# Patient Record
Sex: Male | Born: 1965 | Race: White | Hispanic: No | Marital: Married
Health system: Midwestern US, Academic
[De-identification: ages and names within clinical notes are randomized; demographics above are authoritative.]

---

## 2017-02-15 ENCOUNTER — Encounter: Admit: 2017-02-15 | Discharge: 2017-02-15

## 2017-02-16 MED ORDER — METOPROLOL TARTRATE 100 MG PO TAB
ORAL_TABLET | Freq: Two times a day (BID) | ORAL | 0 refills | 90.00000 days | Status: AC
Start: 2017-02-16 — End: 2017-05-19

## 2017-04-30 ENCOUNTER — Encounter: Admit: 2017-04-30 | Discharge: 2017-04-30

## 2017-05-02 MED ORDER — FLECAINIDE 50 MG PO TAB
ORAL_TABLET | Freq: Two times a day (BID) | ORAL | 4 refills | 30.00000 days | Status: AC
Start: 2017-05-02 — End: 2017-09-30

## 2017-05-19 ENCOUNTER — Encounter: Admit: 2017-05-19 | Discharge: 2017-05-19

## 2017-05-19 MED ORDER — METOPROLOL TARTRATE 100 MG PO TAB
ORAL_TABLET | Freq: Two times a day (BID) | ORAL | 0 refills | 90.00000 days | Status: AC
Start: 2017-05-19 — End: 2017-08-21

## 2017-08-19 ENCOUNTER — Encounter: Admit: 2017-08-19 | Discharge: 2017-08-19

## 2017-08-21 MED ORDER — METOPROLOL TARTRATE 100 MG PO TAB
ORAL_TABLET | Freq: Two times a day (BID) | ORAL | 3 refills | 90.00000 days | Status: AC
Start: 2017-08-21 — End: 2018-08-28

## 2017-09-30 ENCOUNTER — Encounter: Admit: 2017-09-30 | Discharge: 2017-09-30

## 2017-09-30 MED ORDER — FLECAINIDE 50 MG PO TAB
ORAL_TABLET | Freq: Two times a day (BID) | ORAL | 0 refills | 30.00000 days | Status: AC
Start: 2017-09-30 — End: 2017-11-01

## 2017-10-31 ENCOUNTER — Encounter: Admit: 2017-10-31 | Discharge: 2017-10-31

## 2017-11-01 MED ORDER — FLECAINIDE 50 MG PO TAB
ORAL_TABLET | Freq: Two times a day (BID) | ORAL | 0 refills | 30.00000 days | Status: AC
Start: 2017-11-01 — End: 2017-11-28

## 2017-11-27 ENCOUNTER — Encounter: Admit: 2017-11-27 | Discharge: 2017-11-27

## 2017-11-28 MED ORDER — FLECAINIDE 50 MG PO TAB
ORAL_TABLET | Freq: Two times a day (BID) | ORAL | 0 refills | 30.00000 days | Status: AC
Start: 2017-11-28 — End: 2018-02-27

## 2017-12-02 ENCOUNTER — Encounter: Admit: 2017-12-02 | Discharge: 2017-12-02

## 2017-12-02 ENCOUNTER — Ambulatory Visit: Admit: 2017-12-02 | Discharge: 2017-12-03

## 2017-12-02 DIAGNOSIS — E119 Type 2 diabetes mellitus without complications: ICD-10-CM

## 2017-12-02 DIAGNOSIS — G4733 Obstructive sleep apnea (adult) (pediatric): Principal | ICD-10-CM

## 2017-12-02 DIAGNOSIS — I10 Essential (primary) hypertension: ICD-10-CM

## 2017-12-02 DIAGNOSIS — I517 Cardiomegaly: ICD-10-CM

## 2017-12-02 DIAGNOSIS — Z9229 Personal history of other drug therapy: ICD-10-CM

## 2017-12-02 DIAGNOSIS — I4891 Unspecified atrial fibrillation: ICD-10-CM

## 2017-12-02 DIAGNOSIS — Z0389 Encounter for observation for other suspected diseases and conditions ruled out: ICD-10-CM

## 2017-12-02 DIAGNOSIS — Z9049 Acquired absence of other specified parts of digestive tract: ICD-10-CM

## 2017-12-02 DIAGNOSIS — Z9889 Other specified postprocedural states: Secondary | ICD-10-CM

## 2017-12-03 DIAGNOSIS — I480 Paroxysmal atrial fibrillation: Principal | ICD-10-CM

## 2017-12-16 ENCOUNTER — Encounter: Admit: 2017-12-16 | Discharge: 2017-12-16

## 2017-12-21 ENCOUNTER — Encounter: Admit: 2017-12-21 | Discharge: 2017-12-21

## 2017-12-21 MED ORDER — SODIUM CHLORIDE 0.9 % IV SOLP
500 mL | INTRAVENOUS | 0 refills | Status: AC | PRN
Start: 2017-12-21 — End: ?

## 2017-12-23 ENCOUNTER — Encounter: Admit: 2017-12-23 | Discharge: 2017-12-23

## 2017-12-23 ENCOUNTER — Ambulatory Visit: Admit: 2017-12-23 | Discharge: 2017-12-24

## 2017-12-23 MED ORDER — LISINOPRIL 10 MG PO TAB
10 mg | ORAL_TABLET | Freq: Every day | ORAL | 3 refills | Status: AC
Start: 2017-12-23 — End: 2018-12-28

## 2017-12-28 ENCOUNTER — Ambulatory Visit: Admit: 2017-12-28 | Discharge: 2017-12-29

## 2017-12-28 ENCOUNTER — Encounter: Admit: 2017-12-28 | Discharge: 2017-12-28

## 2017-12-28 MED ORDER — REGADENOSON 0.4 MG/5 ML IV SYRG
.4 mg | Freq: Once | INTRAVENOUS | 0 refills | Status: CP
Start: 2017-12-28 — End: ?

## 2018-01-05 ENCOUNTER — Encounter: Admit: 2018-01-05 | Discharge: 2018-01-05

## 2018-02-25 ENCOUNTER — Encounter: Admit: 2018-02-25 | Discharge: 2018-02-25

## 2018-02-27 MED ORDER — FLECAINIDE 50 MG PO TAB
ORAL_TABLET | Freq: Two times a day (BID) | ORAL | 3 refills | 30.00000 days | Status: AC
Start: 2018-02-27 — End: 2018-08-04

## 2018-07-25 ENCOUNTER — Encounter: Admit: 2018-07-25 | Discharge: 2018-07-25

## 2018-07-25 DIAGNOSIS — R109 Unspecified abdominal pain: Secondary | ICD-10-CM

## 2018-07-26 ENCOUNTER — Inpatient Hospital Stay: Admit: 2018-07-26 | Discharge: 2018-07-26

## 2018-07-26 ENCOUNTER — Encounter: Admit: 2018-07-26 | Discharge: 2018-07-27

## 2018-07-26 ENCOUNTER — Encounter: Admit: 2018-07-26 | Discharge: 2018-07-26

## 2018-07-26 DIAGNOSIS — Q639 Congenital malformation of kidney, unspecified: Principal | ICD-10-CM

## 2018-07-26 DIAGNOSIS — R109 Unspecified abdominal pain: ICD-10-CM

## 2018-07-26 LAB — COMPREHENSIVE METABOLIC PANEL
Lab: 1 mg/dL (ref 0.4–1.24)
Lab: 101 MMOL/L — ABNORMAL LOW (ref 98–110)
Lab: 134 MMOL/L — ABNORMAL LOW (ref 137–147)
Lab: 184 mg/dL — ABNORMAL HIGH (ref 70–100)
Lab: 23 mg/dL (ref 7–25)
Lab: 25 MMOL/L (ref 21–30)
Lab: 3.9 g/dL (ref 3.5–5.0)
Lab: 67 U/L — ABNORMAL HIGH (ref 7–56)
Lab: 7.3 g/dL (ref 6.0–8.0)
Lab: 9.4 mg/dL (ref 8.5–10.6)
Lab: 97 U/L — ABNORMAL HIGH (ref 7–40)
Lab: >60 mL/min (ref >60)
Lab: >60 mL/min — ABNORMAL HIGH (ref >60)

## 2018-07-26 LAB — POC GLUCOSE
Lab: 181 mg/dL — ABNORMAL HIGH (ref 70–100)
Lab: 200 mg/dL — ABNORMAL HIGH (ref 70–100)
Lab: 201 mg/dL — ABNORMAL HIGH (ref 70–100)
Lab: 257 mg/dL — ABNORMAL HIGH (ref 70–100)

## 2018-07-26 LAB — MAGNESIUM: Lab: 1.5 mg/dL — ABNORMAL LOW (ref 1.6–2.6)

## 2018-07-26 LAB — CBC AND DIFF
Lab: 0.1 10*3/uL (ref 0–0.20)
Lab: 12 10*3/uL — ABNORMAL HIGH (ref 4.5–11.0)

## 2018-07-26 LAB — LIPASE: Lab: 46 U/L (ref 11–82)

## 2018-07-26 LAB — HEMOGLOBIN A1C: Lab: 7.9 % — ABNORMAL HIGH (ref 4.0–6.0)

## 2018-07-26 MED ORDER — TRAMADOL 50 MG PO TAB
50 mg | Freq: Once | ORAL | 0 refills | Status: CP
Start: 2018-07-26 — End: ?
  Administered 2018-07-26: 14:00:00 50 mg via ORAL

## 2018-07-26 MED ORDER — ENOXAPARIN 120 MG/0.8 ML SC SYRG
1 mg/kg | Freq: Two times a day (BID) | SUBCUTANEOUS | 0 refills | Status: DC
Start: 2018-07-26 — End: 2018-07-31
  Administered 2018-07-26 – 2018-07-31 (×11): 120 mg via SUBCUTANEOUS

## 2018-07-26 MED ORDER — METOPROLOL SUCCINATE 50 MG PO TB24
25 mg | Freq: Two times a day (BID) | ORAL | 0 refills | Status: DC
Start: 2018-07-26 — End: 2018-07-27
  Administered 2018-07-26 – 2018-07-27 (×3): 25 mg via ORAL

## 2018-07-26 MED ORDER — OXYCODONE 5 MG PO TAB
5-10 mg | ORAL | 0 refills | Status: DC | PRN
Start: 2018-07-26 — End: 2018-08-04
  Administered 2018-07-26 – 2018-07-27 (×2): 5 mg via ORAL
  Administered 2018-07-27 (×2): 10 mg via ORAL
  Administered 2018-07-28: 5 mg via ORAL
  Administered 2018-08-04: 15:00:00 10 mg via ORAL

## 2018-07-26 MED ORDER — POLYETHYLENE GLYCOL 3350 17 GRAM PO PWPK
1 | Freq: Every day | ORAL | 0 refills | Status: DC | PRN
Start: 2018-07-26 — End: 2018-08-04
  Administered 2018-07-26: 21:00:00 17 g via ORAL

## 2018-07-26 MED ORDER — HYDROCHLOROTHIAZIDE 12.5 MG PO TAB
12.5 mg | Freq: Every day | ORAL | 0 refills | Status: DC
Start: 2018-07-26 — End: 2018-07-26

## 2018-07-26 MED ORDER — FLECAINIDE 100 MG PO TAB
50 mg | Freq: Two times a day (BID) | ORAL | 0 refills | Status: DC
Start: 2018-07-26 — End: 2018-07-27
  Administered 2018-07-26 – 2018-07-27 (×3): 50 mg via ORAL

## 2018-07-26 MED ORDER — DOCUSATE SODIUM 100 MG PO CAP
100 mg | Freq: Two times a day (BID) | ORAL | 0 refills | Status: DC
Start: 2018-07-26 — End: 2018-08-04
  Administered 2018-07-26 – 2018-08-04 (×11): 100 mg via ORAL

## 2018-07-26 MED ORDER — BISACODYL 10 MG RE SUPP
10 mg | Freq: Once | RECTAL | 0 refills | Status: CP
Start: 2018-07-26 — End: ?
  Administered 2018-07-26: 21:00:00 10 mg via RECTAL

## 2018-07-26 MED ORDER — ACETAMINOPHEN 325 MG PO TAB
650 mg | ORAL | 0 refills | Status: DC | PRN
Start: 2018-07-26 — End: 2018-08-04
  Administered 2018-07-26 – 2018-07-27 (×2): 650 mg via ORAL

## 2018-07-26 MED ORDER — INSULIN ASPART 100 UNIT/ML SC FLEXPEN
0-6 [IU] | Freq: Before meals | SUBCUTANEOUS | 0 refills | Status: DC
Start: 2018-07-26 — End: 2018-07-27
  Administered 2018-07-26: 18:00:00 2 [IU] via SUBCUTANEOUS

## 2018-07-26 MED ORDER — FENTANYL CITRATE (PF) 50 MCG/ML IJ SOLN
25-50 ug | INTRAVENOUS | 0 refills | Status: DC | PRN
Start: 2018-07-26 — End: 2018-07-29
  Administered 2018-07-26 (×2): 50 ug via INTRAVENOUS

## 2018-07-26 MED ORDER — SENNOSIDES-DOCUSATE SODIUM 8.6-50 MG PO TAB
1 | Freq: Two times a day (BID) | ORAL | 0 refills | Status: DC
Start: 2018-07-26 — End: 2018-08-04
  Administered 2018-07-26 – 2018-08-02 (×11): 1 via ORAL

## 2018-07-26 MED ORDER — OXYCODONE 5 MG PO TAB
5 mg | Freq: Once | ORAL | 0 refills | Status: CP
Start: 2018-07-26 — End: ?
  Administered 2018-07-26: 12:00:00 5 mg via ORAL

## 2018-07-27 ENCOUNTER — Encounter: Admit: 2018-07-27 | Discharge: 2018-07-27

## 2018-07-27 ENCOUNTER — Inpatient Hospital Stay: Admit: 2018-07-27 | Discharge: 2018-07-27

## 2018-07-27 DIAGNOSIS — Z9229 Personal history of other drug therapy: ICD-10-CM

## 2018-07-27 DIAGNOSIS — R109 Unspecified abdominal pain: ICD-10-CM

## 2018-07-27 DIAGNOSIS — Z0389 Encounter for observation for other suspected diseases and conditions ruled out: ICD-10-CM

## 2018-07-27 DIAGNOSIS — G4733 Obstructive sleep apnea (adult) (pediatric): Principal | ICD-10-CM

## 2018-07-27 DIAGNOSIS — Z9049 Acquired absence of other specified parts of digestive tract: ICD-10-CM

## 2018-07-27 DIAGNOSIS — E119 Type 2 diabetes mellitus without complications: ICD-10-CM

## 2018-07-27 DIAGNOSIS — I4891 Unspecified atrial fibrillation: ICD-10-CM

## 2018-07-27 DIAGNOSIS — I517 Cardiomegaly: ICD-10-CM

## 2018-07-27 DIAGNOSIS — Z9889 Other specified postprocedural states: ICD-10-CM

## 2018-07-27 DIAGNOSIS — I10 Essential (primary) hypertension: ICD-10-CM

## 2018-07-27 LAB — URINALYSIS DIPSTICK
Lab: NEGATIVE
Lab: NEGATIVE
Lab: NEGATIVE
Lab: NEGATIVE

## 2018-07-27 LAB — URINALYSIS, MICROSCOPIC

## 2018-07-27 LAB — POC GLUCOSE
Lab: 191 mg/dL — ABNORMAL HIGH (ref 70–100)
Lab: 230 mg/dL — ABNORMAL HIGH (ref 70–100)
Lab: 196 mg/dL — ABNORMAL HIGH (ref 70–100)
Lab: 224 mg/dL — ABNORMAL HIGH (ref 70–100)
Lab: 204 mg/dL — ABNORMAL HIGH (ref 70–100)

## 2018-07-27 LAB — COMPREHENSIVE METABOLIC PANEL: Lab: 131 MMOL/L — ABNORMAL LOW (ref >60)

## 2018-07-27 LAB — LACTIC ACID(LACTATE): Lab: 1 MMOL/L (ref 0.5–2.0)

## 2018-07-27 LAB — MAGNESIUM: Lab: 1.4 mg/dL — ABNORMAL LOW (ref 1.6–2.6)

## 2018-07-27 LAB — CA19.9: Lab: 6 U/mL — ABNORMAL LOW (ref <35)

## 2018-07-27 LAB — CBC AND DIFF: Lab: 20 K/UL — ABNORMAL HIGH (ref >60)

## 2018-07-27 MED ORDER — BISACODYL 10 MG RE SUPP
10 mg | Freq: Once | RECTAL | 0 refills | Status: CP
Start: 2018-07-27 — End: ?
  Administered 2018-07-27: 19:00:00 10 mg via RECTAL

## 2018-07-27 MED ORDER — LACTATED RINGERS IV SOLP
1000 mL | Freq: Once | INTRAVENOUS | 0 refills | Status: CP
Start: 2018-07-27 — End: ?
  Administered 2018-07-27: 14:00:00 1000 mL via INTRAVENOUS

## 2018-07-27 MED ORDER — BISACODYL 10 MG RE SUPP
10 mg | Freq: Every day | RECTAL | 0 refills | Status: DC | PRN
Start: 2018-07-27 — End: 2018-08-04

## 2018-07-27 MED ORDER — DIGOXIN 250 MCG/ML (0.25 MG/ML) IJ SOLN
125 ug | Freq: Once | INTRAVENOUS | 0 refills | Status: CP
Start: 2018-07-27 — End: ?
  Administered 2018-07-28: 125 ug via INTRAVENOUS

## 2018-07-27 MED ORDER — DILTIAZEM 125MG/125ML IV DRIP
5-15 mg/h | INTRAVENOUS | 0 refills | Status: DC
Start: 2018-07-27 — End: 2018-07-31
  Administered 2018-07-27 (×2): 5 mg/h via INTRAVENOUS
  Administered 2018-07-28 (×6): 15 mg/h via INTRAVENOUS
  Administered 2018-07-29: 15:00:00 5 mg/h via INTRAVENOUS
  Administered 2018-07-29 (×2): 15 mg/h via INTRAVENOUS
  Administered 2018-07-29 – 2018-07-31 (×7): 5 mg/h via INTRAVENOUS

## 2018-07-27 MED ORDER — PEG-ELECTROLYTE SOLN 420 GRAM PO SOLR
4 L | Freq: Once | ORAL | 0 refills | Status: CP
Start: 2018-07-27 — End: ?
  Administered 2018-07-27: 16:00:00 4 L via ORAL

## 2018-07-27 MED ORDER — PERFLUTREN LIPID MICROSPHERES 1.1 MG/ML IV SUSP
1-20 mL | Freq: Once | INTRAVENOUS | 0 refills | Status: CP | PRN
Start: 2018-07-27 — End: ?
  Administered 2018-07-27: 19:00:00 2 mL via INTRAVENOUS

## 2018-07-27 MED ORDER — METOPROLOL SUCCINATE 50 MG PO TB24
50 mg | Freq: Two times a day (BID) | ORAL | 0 refills | Status: DC
Start: 2018-07-27 — End: 2018-07-29
  Administered 2018-07-28 – 2018-07-29 (×4): 50 mg via ORAL

## 2018-07-27 MED ORDER — PIPERACILLIN/TAZOBACTAM 3.375 G/NS IVPB (MB+)
3.375 g | INTRAVENOUS | 0 refills | Status: DC
Start: 2018-07-27 — End: 2018-07-30
  Administered 2018-07-27 – 2018-07-29 (×16): 3.375 g via INTRAVENOUS

## 2018-07-27 MED ORDER — METOPROLOL TARTRATE 5 MG/5 ML IV SOLN
5 mg | Freq: Once | INTRAVENOUS | 0 refills | Status: CP
Start: 2018-07-27 — End: ?
  Administered 2018-07-27: 15:00:00 5 mg via INTRAVENOUS

## 2018-07-27 MED ORDER — INSULIN ASPART 100 UNIT/ML SC FLEXPEN
0-12 [IU] | Freq: Before meals | SUBCUTANEOUS | 0 refills | Status: DC
Start: 2018-07-27 — End: 2018-08-04

## 2018-07-27 MED ORDER — MAGNESIUM SULFATE IN D5W 1 GRAM/100 ML IV PGBK
1 g | INTRAVENOUS | 0 refills | Status: CP
Start: 2018-07-27 — End: ?
  Administered 2018-07-27 (×3): 1 g via INTRAVENOUS

## 2018-07-28 ENCOUNTER — Encounter: Admit: 2018-07-28 | Discharge: 2018-07-28

## 2018-07-28 DIAGNOSIS — E119 Type 2 diabetes mellitus without complications: ICD-10-CM

## 2018-07-28 DIAGNOSIS — Z9229 Personal history of other drug therapy: ICD-10-CM

## 2018-07-28 DIAGNOSIS — G4733 Obstructive sleep apnea (adult) (pediatric): Principal | ICD-10-CM

## 2018-07-28 DIAGNOSIS — I10 Essential (primary) hypertension: ICD-10-CM

## 2018-07-28 DIAGNOSIS — I517 Cardiomegaly: ICD-10-CM

## 2018-07-28 DIAGNOSIS — Z9889 Other specified postprocedural states: ICD-10-CM

## 2018-07-28 DIAGNOSIS — I4891 Unspecified atrial fibrillation: ICD-10-CM

## 2018-07-28 DIAGNOSIS — Z9049 Acquired absence of other specified parts of digestive tract: ICD-10-CM

## 2018-07-28 DIAGNOSIS — Z0389 Encounter for observation for other suspected diseases and conditions ruled out: ICD-10-CM

## 2018-07-28 LAB — COMPREHENSIVE METABOLIC PANEL
Lab: 133 MMOL/L — ABNORMAL LOW (ref 137–147)
Lab: 3.5 MMOL/L (ref 3.5–5.1)

## 2018-07-28 LAB — POC GLUCOSE
Lab: 170 mg/dL — ABNORMAL HIGH (ref 70–100)
Lab: 183 mg/dL — ABNORMAL HIGH (ref 70–100)
Lab: 178 mg/dL — ABNORMAL HIGH (ref 70–100)
Lab: 172 mg/dL — ABNORMAL HIGH (ref 70–100)

## 2018-07-28 LAB — CBC AND DIFF
Lab: 18 K/UL — ABNORMAL HIGH (ref 4.5–11.0)
Lab: 4.3 M/UL — ABNORMAL LOW (ref >60)

## 2018-07-28 LAB — DIGOXIN LEVEL: Lab: 0.4 ng/mL — ABNORMAL LOW (ref >60)

## 2018-07-28 LAB — URINALYSIS DIPSTICK REFLEX TO CULTURE
Lab: NEGATIVE
Lab: NEGATIVE
Lab: NEGATIVE

## 2018-07-28 LAB — URINALYSIS MICROSCOPIC REFLEX TO CULTURE

## 2018-07-28 MED ORDER — PROPOFOL INJ 10 MG/ML IV VIAL
0 refills | Status: DC
Start: 2018-07-28 — End: 2018-07-29
  Administered 2018-07-29 (×2): 40 mg via INTRAVENOUS

## 2018-07-28 MED ORDER — SODIUM CHLORIDE 0.9 % IV SOLP
INTRAVENOUS | 0 refills | Status: DC
Start: 2018-07-28 — End: 2018-07-31
  Administered 2018-07-29: 01:00:00 1000.000 mL via INTRAVENOUS

## 2018-07-28 MED ORDER — PROMETHAZINE 25 MG/ML IJ SOLN
6.25 mg | INTRAVENOUS | 0 refills | Status: DC | PRN
Start: 2018-07-28 — End: 2018-07-29

## 2018-07-28 MED ORDER — METOPROLOL TARTRATE 5 MG/5 ML IV SOLN
5 mg | Freq: Once | INTRAVENOUS | 0 refills | Status: CP
Start: 2018-07-28 — End: ?
  Administered 2018-07-28: 5 mg via INTRAVENOUS

## 2018-07-28 MED ORDER — DIPHENHYDRAMINE HCL 50 MG/ML IJ SOLN
25 mg | Freq: Once | INTRAVENOUS | 0 refills | Status: DC | PRN
Start: 2018-07-28 — End: 2018-07-29

## 2018-07-28 MED ORDER — LIDOCAINE (PF) 200 MG/10 ML (2 %) IJ SYRG
0 refills | Status: DC
Start: 2018-07-28 — End: 2018-07-29
  Administered 2018-07-29: 01:00:00 100 mg via INTRAVENOUS

## 2018-07-28 MED ORDER — DEXTRAN 70-HYPROMELLOSE 0.1-0.3 % OP DROP
1 [drp] | OPHTHALMIC | 0 refills | Status: DC | PRN
Start: 2018-07-28 — End: 2018-08-04
  Administered 2018-07-29: 05:00:00 1 [drp] via OPHTHALMIC

## 2018-07-28 MED ORDER — FENTANYL CITRATE (PF) 50 MCG/ML IJ SOLN
50 ug | INTRAVENOUS | 0 refills | Status: DC | PRN
Start: 2018-07-28 — End: 2018-07-29

## 2018-07-28 MED ORDER — FENTANYL CITRATE (PF) 50 MCG/ML IJ SOLN
25 ug | INTRAVENOUS | 0 refills | Status: DC | PRN
Start: 2018-07-28 — End: 2018-07-29

## 2018-07-28 MED ORDER — PROPOFOL 10 MG/ML IV EMUL 20 ML (INFUSION)(AM)(OR)
0 refills | Status: DC
Start: 2018-07-28 — End: 2018-07-29
  Administered 2018-07-29: 01:00:00 90 ug/kg/min via INTRAVENOUS

## 2018-07-28 MED ORDER — DIGOXIN 250 MCG/ML (0.25 MG/ML) IJ SOLN
500 ug | Freq: Once | INTRAVENOUS | 0 refills | Status: CP
Start: 2018-07-28 — End: ?
  Administered 2018-07-28: 22:00:00 500 ug via INTRAVENOUS

## 2018-07-28 MED ORDER — DIGOXIN 250 MCG/ML (0.25 MG/ML) IJ SOLN
250 ug | INTRAVENOUS | 0 refills | Status: AC
Start: 2018-07-28 — End: ?
  Administered 2018-07-29: 11:00:00 250 ug via INTRAVENOUS

## 2018-07-28 MED ORDER — METOCLOPRAMIDE HCL 5 MG/ML IJ SOLN
10 mg | Freq: Once | INTRAVENOUS | 0 refills | Status: DC | PRN
Start: 2018-07-28 — End: 2018-07-29

## 2018-07-29 ENCOUNTER — Encounter: Admit: 2018-07-29 | Discharge: 2018-07-29

## 2018-07-29 ENCOUNTER — Inpatient Hospital Stay: Admit: 2018-07-29 | Discharge: 2018-07-29

## 2018-07-29 ENCOUNTER — Inpatient Hospital Stay: Admit: 2018-07-27 | Discharge: 2018-07-27

## 2018-07-29 DIAGNOSIS — R109 Unspecified abdominal pain: ICD-10-CM

## 2018-07-29 LAB — POC GLUCOSE
Lab: 172 mg/dL — ABNORMAL HIGH (ref 70–100)
Lab: 252 mg/dL — ABNORMAL HIGH (ref 70–100)
Lab: 205 mg/dL — ABNORMAL HIGH (ref 70–100)
Lab: 185 mg/dL — ABNORMAL HIGH (ref 70–100)

## 2018-07-29 LAB — COMPREHENSIVE METABOLIC PANEL: Lab: 132 MMOL/L — ABNORMAL LOW (ref 137–147)

## 2018-07-29 LAB — CBC AND DIFF: Lab: 13 10*3/uL — ABNORMAL HIGH (ref >60)

## 2018-07-29 MED ORDER — GADOBENATE DIMEGLUMINE 529 MG/ML (0.1MMOL/0.2ML) IV SOLN
20 mL | Freq: Once | INTRAVENOUS | 0 refills | Status: CP
Start: 2018-07-29 — End: ?
  Administered 2018-07-30: 01:00:00 20 mL via INTRAVENOUS

## 2018-07-29 MED ORDER — METOPROLOL SUCCINATE 50 MG PO TB24
100 mg | Freq: Two times a day (BID) | ORAL | 0 refills | Status: DC
Start: 2018-07-29 — End: 2018-08-02
  Administered 2018-07-30 – 2018-08-02 (×8): 100 mg via ORAL

## 2018-07-29 MED ORDER — SODIUM CHLORIDE 0.9 % IJ SOLN
50 mL | Freq: Once | INTRAVENOUS | 0 refills | Status: CP
Start: 2018-07-29 — End: ?
  Administered 2018-07-30: 01:00:00 50 mL via INTRAVENOUS

## 2018-07-30 LAB — COMPREHENSIVE METABOLIC PANEL: Lab: 137 MMOL/L — ABNORMAL LOW (ref 137–147)

## 2018-07-30 LAB — CBC AND DIFF: Lab: 9.4 K/UL — ABNORMAL LOW (ref >60)

## 2018-07-30 LAB — DIGOXIN LEVEL: Lab: 0.7 ng/mL (ref 0.5–1.0)

## 2018-07-30 LAB — POC GLUCOSE
Lab: 134 mg/dL — ABNORMAL HIGH (ref 70–100)
Lab: 148 mg/dL — ABNORMAL HIGH (ref 70–100)
Lab: 170 mg/dL — ABNORMAL HIGH (ref 70–100)
Lab: 180 mg/dL — ABNORMAL HIGH (ref 70–100)
Lab: 184 mg/dL — ABNORMAL HIGH (ref 70–100)
Lab: 265 mg/dL — ABNORMAL HIGH (ref 70–100)

## 2018-07-30 MED ORDER — LISINOPRIL 5 MG PO TAB
10 mg | Freq: Every day | ORAL | 0 refills | Status: DC
Start: 2018-07-30 — End: 2018-08-04
  Administered 2018-07-30 – 2018-08-04 (×6): 10 mg via ORAL

## 2018-07-31 ENCOUNTER — Inpatient Hospital Stay: Admit: 2018-07-31 | Discharge: 2018-07-31

## 2018-07-31 ENCOUNTER — Encounter: Admit: 2018-07-31 | Discharge: 2018-07-31

## 2018-07-31 DIAGNOSIS — E119 Type 2 diabetes mellitus without complications: ICD-10-CM

## 2018-07-31 DIAGNOSIS — I4891 Unspecified atrial fibrillation: ICD-10-CM

## 2018-07-31 DIAGNOSIS — R109 Unspecified abdominal pain: ICD-10-CM

## 2018-07-31 DIAGNOSIS — I10 Essential (primary) hypertension: ICD-10-CM

## 2018-07-31 DIAGNOSIS — H269 Unspecified cataract: ICD-10-CM

## 2018-07-31 DIAGNOSIS — G4733 Obstructive sleep apnea (adult) (pediatric): Principal | ICD-10-CM

## 2018-07-31 DIAGNOSIS — E669 Obesity, unspecified: ICD-10-CM

## 2018-07-31 DIAGNOSIS — I517 Cardiomegaly: ICD-10-CM

## 2018-07-31 DIAGNOSIS — Z9889 Other specified postprocedural states: Secondary | ICD-10-CM

## 2018-07-31 DIAGNOSIS — Z87442 Personal history of urinary calculi: ICD-10-CM

## 2018-07-31 DIAGNOSIS — Z9229 Personal history of other drug therapy: ICD-10-CM

## 2018-07-31 LAB — CBC AND DIFF
Lab: 4.2 M/UL — ABNORMAL LOW (ref >60)
Lab: 8.9 K/UL — ABNORMAL LOW (ref 4.5–11.0)

## 2018-07-31 LAB — COMPREHENSIVE METABOLIC PANEL
Lab: 137 MMOL/L — ABNORMAL LOW (ref 137–147)
Lab: 3.8 MMOL/L (ref >60)

## 2018-07-31 LAB — POC GLUCOSE
Lab: 186 mg/dL — ABNORMAL HIGH (ref 70–100)
Lab: 169 mg/dL — ABNORMAL HIGH (ref 70–100)
Lab: 155 mg/dL — ABNORMAL HIGH (ref 70–100)
Lab: 125 mg/dL — ABNORMAL HIGH (ref 70–100)

## 2018-07-31 MED ORDER — PROPOFOL 10 MG/ML IV EMUL 20 ML (INFUSION)(AM)(OR)
INTRAVENOUS | 0 refills | Status: DC
Start: 2018-07-31 — End: 2018-07-31
  Administered 2018-07-31: 20:00:00 140 ug/kg/min via INTRAVENOUS

## 2018-07-31 MED ORDER — PROMETHAZINE 25 MG/ML IJ SOLN
6.25 mg | INTRAVENOUS | 0 refills | Status: CN | PRN
Start: 2018-07-31 — End: ?

## 2018-07-31 MED ORDER — LIDOCAINE (PF) 200 MG/10 ML (2 %) IJ SYRG
0 refills | Status: DC
Start: 2018-07-31 — End: 2018-07-31
  Administered 2018-07-31: 20:00:00 80 mg via INTRAVENOUS

## 2018-07-31 MED ORDER — SODIUM CHLORIDE 0.9 % IV SOLP
500 mL | INTRAVENOUS | 0 refills | Status: DC
Start: 2018-07-31 — End: 2018-07-31

## 2018-07-31 MED ORDER — METOCLOPRAMIDE HCL 5 MG/ML IJ SOLN
10 mg | Freq: Once | INTRAVENOUS | 0 refills | Status: CN | PRN
Start: 2018-07-31 — End: ?

## 2018-07-31 MED ORDER — PROPOFOL INJ 10 MG/ML IV VIAL
0 refills | Status: DC
Start: 2018-07-31 — End: 2018-07-31
  Administered 2018-07-31: 20:00:00 10 mg via INTRAVENOUS
  Administered 2018-07-31: 20:00:00 70 mg via INTRAVENOUS

## 2018-07-31 MED ORDER — FLECAINIDE 100 MG PO TAB
50 mg | Freq: Two times a day (BID) | ORAL | 0 refills | Status: DC
Start: 2018-07-31 — End: 2018-08-02
  Administered 2018-08-01 – 2018-08-02 (×4): 50 mg via ORAL

## 2018-07-31 MED ORDER — FENTANYL CITRATE (PF) 50 MCG/ML IJ SOLN
25 ug | INTRAVENOUS | 0 refills | Status: CN | PRN
Start: 2018-07-31 — End: ?

## 2018-07-31 MED ORDER — DILTIAZEM BOLUS FOR CONTINUOUS INFUSION
30 mg | Freq: Once | INTRAVENOUS | 0 refills | Status: CP
Start: 2018-07-31 — End: ?

## 2018-07-31 MED ORDER — KETAMINE 10 MG/ML IJ SOLN
0 refills | Status: DC
Start: 2018-07-31 — End: 2018-07-31
  Administered 2018-07-31: 20:00:00 20 mg via INTRAVENOUS

## 2018-07-31 MED ORDER — DILTIAZEM 125MG/125ML IV DRIP
5-15 mg/h | INTRAVENOUS | 0 refills | Status: DC
Start: 2018-07-31 — End: 2018-08-02

## 2018-07-31 MED ORDER — SODIUM CHLORIDE 0.9 % IV SOLP (OR) 500ML
0 refills | Status: DC
Start: 2018-07-31 — End: 2018-07-31
  Administered 2018-07-31: 20:00:00 via INTRAVENOUS

## 2018-07-31 MED ORDER — APIXABAN 5 MG PO TAB
5 mg | Freq: Two times a day (BID) | ORAL | 0 refills | Status: DC
Start: 2018-07-31 — End: 2018-08-04
  Administered 2018-08-01 – 2018-08-04 (×8): 5 mg via ORAL

## 2018-08-01 LAB — POC GLUCOSE
Lab: 152 mg/dL — ABNORMAL HIGH (ref 70–100)
Lab: 201 mg/dL — ABNORMAL HIGH (ref 70–100)
Lab: 176 mg/dL — ABNORMAL HIGH (ref 70–100)
Lab: 194 mg/dL — ABNORMAL HIGH (ref 70–100)

## 2018-08-01 LAB — CBC AND DIFF: Lab: 11 K/UL — ABNORMAL HIGH (ref 4.5–11.0)

## 2018-08-01 LAB — COMPREHENSIVE METABOLIC PANEL: Lab: 135 MMOL/L — ABNORMAL LOW (ref >60)

## 2018-08-01 MED ORDER — FLECAINIDE 100 MG PO TAB
300 mg | Freq: Once | ORAL | 0 refills | Status: CP
Start: 2018-08-01 — End: ?
  Administered 2018-08-01: 19:00:00 300 mg via ORAL

## 2018-08-02 LAB — CBC AND DIFF: Lab: 8.9 K/UL — ABNORMAL HIGH (ref 4.5–11.0)

## 2018-08-02 LAB — CULTURE-BLOOD W/SENSITIVITY

## 2018-08-02 LAB — POC GLUCOSE
Lab: 163 mg/dL — ABNORMAL HIGH (ref 70–100)
Lab: 169 mg/dL — ABNORMAL HIGH (ref 70–100)
Lab: 172 mg/dL — ABNORMAL HIGH (ref 70–100)
Lab: 218 mg/dL — ABNORMAL HIGH (ref 70–100)

## 2018-08-02 LAB — COMPREHENSIVE METABOLIC PANEL: Lab: 133 MMOL/L — ABNORMAL LOW (ref >60)

## 2018-08-02 MED ORDER — SODIUM CHLORIDE 0.9 % IV SOLP
INTRAVENOUS | 0 refills | Status: DC
Start: 2018-08-02 — End: 2018-08-04
  Administered 2018-08-03: 06:00:00 1000.000 mL via INTRAVENOUS

## 2018-08-02 MED ORDER — METOPROLOL TARTRATE 50 MG PO TAB
150 mg | Freq: Two times a day (BID) | ORAL | 0 refills | Status: DC
Start: 2018-08-02 — End: 2018-08-04
  Administered 2018-08-03 – 2018-08-04 (×4): 150 mg via ORAL

## 2018-08-02 MED ORDER — FLECAINIDE 100 MG PO TAB
100 mg | Freq: Two times a day (BID) | ORAL | 0 refills | Status: DC
Start: 2018-08-02 — End: 2018-08-04
  Administered 2018-08-03 – 2018-08-04 (×4): 100 mg via ORAL

## 2018-08-02 MED ORDER — METOPROLOL SUCCINATE 50 MG PO TB24
150 mg | Freq: Two times a day (BID) | ORAL | 0 refills | Status: DC
Start: 2018-08-02 — End: 2018-08-02

## 2018-08-03 ENCOUNTER — Encounter: Admit: 2018-08-03 | Discharge: 2018-08-03

## 2018-08-03 ENCOUNTER — Inpatient Hospital Stay: Admit: 2018-08-03 | Discharge: 2018-08-03

## 2018-08-03 DIAGNOSIS — E669 Obesity, unspecified: Secondary | ICD-10-CM

## 2018-08-03 DIAGNOSIS — Z9229 Personal history of other drug therapy: Secondary | ICD-10-CM

## 2018-08-03 DIAGNOSIS — I10 Essential (primary) hypertension: Secondary | ICD-10-CM

## 2018-08-03 DIAGNOSIS — I517 Cardiomegaly: Secondary | ICD-10-CM

## 2018-08-03 DIAGNOSIS — E119 Type 2 diabetes mellitus without complications: Secondary | ICD-10-CM

## 2018-08-03 DIAGNOSIS — G4733 Obstructive sleep apnea (adult) (pediatric): Secondary | ICD-10-CM

## 2018-08-03 DIAGNOSIS — Z9889 Other specified postprocedural states: Secondary | ICD-10-CM

## 2018-08-03 DIAGNOSIS — R109 Unspecified abdominal pain: ICD-10-CM

## 2018-08-03 DIAGNOSIS — I4891 Unspecified atrial fibrillation: Secondary | ICD-10-CM

## 2018-08-03 DIAGNOSIS — Z87442 Personal history of urinary calculi: Secondary | ICD-10-CM

## 2018-08-03 DIAGNOSIS — H269 Unspecified cataract: Secondary | ICD-10-CM

## 2018-08-03 LAB — POC GLUCOSE
Lab: 176 mg/dL — ABNORMAL HIGH (ref 70–100)
Lab: 177 mg/dL — ABNORMAL HIGH (ref 70–100)
Lab: 196 mg/dL — ABNORMAL HIGH (ref 70–100)
Lab: 221 mg/dL — ABNORMAL HIGH (ref 70–100)

## 2018-08-03 LAB — COMPREHENSIVE METABOLIC PANEL: Lab: 135 MMOL/L — ABNORMAL LOW (ref 137–147)

## 2018-08-03 LAB — PROTIME INR (PT): Lab: 1.1 mg/dL — ABNORMAL HIGH (ref >60)

## 2018-08-03 LAB — CBC AND DIFF: Lab: 8.5 K/UL — ABNORMAL HIGH (ref 4.5–11.0)

## 2018-08-03 MED ORDER — LIDOCAINE (PF) 200 MG/10 ML (2 %) IJ SYRG
0 refills | Status: DC
Start: 2018-08-03 — End: 2018-08-03
  Administered 2018-08-03: 18:00:00 80 mg via INTRAVENOUS
  Administered 2018-08-03: 18:00:00 40 mg via INTRAVENOUS

## 2018-08-03 MED ORDER — PROPOFOL INJ 10 MG/ML IV VIAL
0 refills | Status: DC
Start: 2018-08-03 — End: 2018-08-03
  Administered 2018-08-03: 18:00:00 20 mg via INTRAVENOUS
  Administered 2018-08-03: 18:00:00 50 mg via INTRAVENOUS
  Administered 2018-08-03: 18:00:00 20 mg via INTRAVENOUS
  Administered 2018-08-03: 18:00:00 30 mg via INTRAVENOUS
  Administered 2018-08-03: 18:00:00 10 mg via INTRAVENOUS

## 2018-08-04 ENCOUNTER — Inpatient Hospital Stay: Admit: 2018-08-03 | Discharge: 2018-08-03

## 2018-08-04 ENCOUNTER — Inpatient Hospital Stay: Admit: 2018-07-31 | Discharge: 2018-07-31

## 2018-08-04 ENCOUNTER — Inpatient Hospital Stay: Admit: 2018-07-27 | Discharge: 2018-07-27

## 2018-08-04 ENCOUNTER — Encounter
Admit: 2018-07-26 | Discharge: 2018-08-04 | Disposition: A | Discharge status: Home / Self Care | Payer: BC Managed Care – PPO | Source: Other Acute Inpatient Hospital

## 2018-08-04 ENCOUNTER — Encounter: Admit: 2018-08-04 | Discharge: 2018-08-04

## 2018-08-04 DIAGNOSIS — Z7901 Long term (current) use of anticoagulants: ICD-10-CM

## 2018-08-04 DIAGNOSIS — Z7984 Long term (current) use of oral hypoglycemic drugs: ICD-10-CM

## 2018-08-04 DIAGNOSIS — G4733 Obstructive sleep apnea (adult) (pediatric): ICD-10-CM

## 2018-08-04 DIAGNOSIS — I10 Essential (primary) hypertension: Secondary | ICD-10-CM

## 2018-08-04 DIAGNOSIS — E785 Hyperlipidemia, unspecified: ICD-10-CM

## 2018-08-04 DIAGNOSIS — Z539 Procedure and treatment not carried out, unspecified reason: Secondary | ICD-10-CM

## 2018-08-04 DIAGNOSIS — K861 Other chronic pancreatitis: ICD-10-CM

## 2018-08-04 DIAGNOSIS — I2510 Atherosclerotic heart disease of native coronary artery without angina pectoris: ICD-10-CM

## 2018-08-04 DIAGNOSIS — E119 Type 2 diabetes mellitus without complications: Secondary | ICD-10-CM

## 2018-08-04 DIAGNOSIS — Z79899 Other long term (current) drug therapy: Secondary | ICD-10-CM

## 2018-08-04 DIAGNOSIS — K859 Acute pancreatitis without necrosis or infection, unspecified: Principal | ICD-10-CM

## 2018-08-04 DIAGNOSIS — N200 Calculus of kidney: ICD-10-CM

## 2018-08-04 DIAGNOSIS — Z9049 Acquired absence of other specified parts of digestive tract: ICD-10-CM

## 2018-08-04 DIAGNOSIS — I480 Paroxysmal atrial fibrillation: ICD-10-CM

## 2018-08-04 DIAGNOSIS — N179 Acute kidney failure, unspecified: ICD-10-CM

## 2018-08-04 DIAGNOSIS — K5900 Constipation, unspecified: ICD-10-CM

## 2018-08-04 DIAGNOSIS — D649 Anemia, unspecified: ICD-10-CM

## 2018-08-04 LAB — COMPREHENSIVE METABOLIC PANEL
Lab: 12 U/L (ref 7–40)
Lab: 134 MMOL/L — ABNORMAL LOW (ref 137–147)
Lab: 22 MMOL/L (ref 21–30)
Lab: 231 mg/dL — ABNORMAL HIGH (ref 70–100)
Lab: 4.2 MMOL/L (ref 3.5–5.1)
Lab: 45 U/L (ref 7–56)
Lab: 63 U/L (ref 25–110)
Lab: >60 mL/min (ref >60)
Lab: >60 mL/min (ref >60)
Lab: 10 (ref 3–12)

## 2018-08-04 LAB — POC GLUCOSE
Lab: 124 mg/dL — ABNORMAL HIGH (ref 70–100)
Lab: 165 mg/dL — ABNORMAL HIGH (ref 70–100)
Lab: 169 mg/dL — ABNORMAL HIGH (ref 70–100)
Lab: 180 mg/dL — ABNORMAL HIGH (ref 70–100)

## 2018-08-04 LAB — CBC AND DIFF
Lab: 4.3 M/UL — ABNORMAL LOW (ref 4.4–5.5)
Lab: 8.4 K/UL — ABNORMAL HIGH (ref >60)

## 2018-08-04 MED ORDER — HYDROCHLOROTHIAZIDE 25 MG PO TAB
25 mg | ORAL_TABLET | Freq: Every morning | ORAL | 0 refills | 28.00000 days | Status: AC
Start: 2018-08-04 — End: 2018-12-11

## 2018-08-04 MED ORDER — CELECOXIB 200 MG PO CAP
200 mg | ORAL_CAPSULE | Freq: Every day | ORAL | 0 refills | Status: AC
Start: 2018-08-04 — End: 2019-11-21

## 2018-08-04 MED ORDER — APIXABAN 5 MG PO TAB
5 mg | ORAL_TABLET | Freq: Two times a day (BID) | ORAL | 1 refills | Status: AC
Start: 2018-08-04 — End: ?
  Filled 2018-08-04 (×2): qty 60, 30d supply, fill #1

## 2018-08-04 MED ORDER — FLECAINIDE 100 MG PO TAB
100 mg | ORAL_TABLET | Freq: Two times a day (BID) | ORAL | 1 refills | 30.00000 days | Status: AC
Start: 2018-08-04 — End: ?
  Filled 2018-08-04 (×2): qty 60, 30d supply, fill #1

## 2018-08-04 NOTE — Progress Notes
Admission History and Physical Examination      Name:  Jeremy Bean                                             MRN:  [id number]   Admission Date:  07/25/2018                     Assessment/Plan:      STEPHANE AUKERMAN Jr?is a 53 y.o.?male?with a PMH of paroxysmal Afib not on AC, hypertension, type 2 diabetes mellitus and obstructive sleep apnea who presents with abdominal and flank pain with concern for left kidney thrombosis.?  ?  Paroxysmal Atrial Fibrillation with RVR:  - Follows with cardiology, seen by Dr. Wallene Huh  - History of paroxysmal Afib with ablation in 2009 and residual atrial flutter and ablation again in 2010  - RVR on 12/26 morning with HR up to 150s.  - Cardiology consulted. Started o Dilt gtt. Echo reviewed.  > Continue PTA metoprolol, increased by cardiology.  > Start Dig load with 500/250/250 and repeat Dig level tomorrow 8 hours after last dose.  > Keep K>4 and Mag>2  - restarted home metoprolol 150mg  BID  > cont eliquis  > had  TEE/DCCV 07/31/18 but reverted back to Afib.  - increased Flecainide 100mg  BID per cards.  - cardiology consulted- had cardioversion again on 08/03/17    Left flank and abdominal pain  Abnormal Appearance of Bilateral Kidneys on CT and Doppler US:  Constipation:  - OSH CT A/P with both kidneys described as very inhomogeneous suggesting potential pyelonephritis vs embolization/infarction. No thrombus is reported on CT.  - UA negative at OSH, afebrile, no leukocytosis  - Low suspicion of pyelonephritis or any urinary tract obstruction  - US doppler here showed normal size kidneys without evidence of renal artery stenosis or thrombosis.?Hypoechoic area in the right kidney and hyperechoic area in the left kidney that correspond to areas of decreased enhancement on the recent CT. These are nonspecific and may represent areas of infection/inflammation or could possibly represent small vessel ischemia. 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 A follow-up CT with contrast in 2-4 weeks is recommended for further evaluation.  - UA unremarkable.  - Urology consulted, most likely this is related to embolic events.   - Pt with no M for 5 days prior to admission. Resolved with golytely with improvement of pain.  > Pain control with PO Oxycodone. DC IV fentanyl as he is not using.  >cont Eliquis with co-pay cards.  > Cont bowel regimen and avoid constipation.  ???  Pancreatic body lesion:  - Former heavy alcohol usage.  - OSH CT A/P with pancreatic body hypodensity 3-4 cm with inflammation  - Lipase and amylase wnl at OSH and here  - GI consulted for EUS 07/28/18-  The pancreas showed features of chronic pancreatitis in the form of lobulation, hyperechoic foci, stranding and hyperechoic pancreatic duct walls. Careful inspection of the pancreas did not reveal any mass lesion.   - MRI pancreatic 07/29/18- Areas of abnormal signal within both kidneys, left greater than right, which is unchanged since the recent CT. Leading consideration is multifocal areas of ischemia/infarct. Abnormal signal involving the pancreatic body with tissue extending into the root of the mesentery, which is unchanged since the recent CT. This most likely represents  acute pancreatitis with an area of walled off Necrosis. Hepatomegaly and diffuse steatosis  -  GI recs- Would recommend to repeat MRCP in 4-6 weeks  > CA 19.9 normal.  ???  Acute kidney injury  - Baseline Cr 0.8  - Cr at OSH 1.33. Now down to 1.09  > Cont hold metformin, hydrochlorothiazide, restarted lisinopril 07/30/18  -  His will give more room for Dilt and other cardiac meds.  > Avoid nephrotoxic medications  ???  Type 2 Diabetes Mellitus  - A1c 7.9  > Holding PTA metformin with need for procedures.  > Cont MDCF.  ???  Hypertension  >restarted lisinopril  -  holding HCTZ  ???  Obstructive sleep apnea  - Wears CPAP at home  > Cont CPAP???    FEN: - replace electrolytes as needed.  - Diet: Cardiac    Ppx- On Eliquis    Dispo: 53 year old male who presented with left flank abdominal pain.  He has abnormal kidney appearance on CT and ultrasound.  Initially was thought to be infarction from thrombosis. Ultrasound Dopplers did not show any blood clots.  Urology is consulted for evaluation and they think this is embolic. Previous physician curb sided Nephrology and they had nothing to offer. They do not think this is IgG4 related disease. He also has 3 cm pancreatic lesion.  GI is consulted for EUS. This was cancelled 12/26 with RVR. He then had EUS which did not show the lesion. Also had MRI pancreatic protocol that showed pancreatitis with walled of necrosis but no lesion. He had Afib w RVR: Started Dilt gtt and cardiology consulted, also had Echo which was unremarkable. TEE/DCCV 07/31/18 and another cardioversion on 08/03/18. Restarted on flecainide per cards. Needs  repeat MRCP in 4-6 weeks per GI.  Patient was seen on rounds today and he was comfortably lying in bed, no acute events overnight. Patient did not have any new complaints this am. Patient was stable on the day of discharge. >35 mins spent in coordinating care and discharging the patient.       Full code    Oneida Arenas, MD  Hospitalist    _____________________________________________________________________________    Subjective:  53 year old male who presented with left flank abdominal pain.  He has abnormal kidney appearance on CT and ultrasound.  Initially was thought to be infarction from thrombosis. Ultrasound Dopplers did not show any blood clots.  Urology is consulted for evaluation and they think this is embolic. Previous physician curb sided Nephrology and they had nothing to offer. They do not think this is IgG4 related disease. He also has 3 cm pancreatic lesion.  GI is consulted for EUS. This was cancelled 12/26 with RVR. He then had EUS which did not show the lesion. Also had MRI pancreatic protocol that showed pancreatitis with walled of necrosis but no lesion. He had Afib w RVR: Started Dilt gtt and cardiology consulted, also had Echo which was unremarkable. TEE/DCCV 07/31/18 and another cardioversion on 08/03/18. Restarted on flecainide per cards. Needs  repeat MRCP in 4-6 weeks per GI.  Patient was seen on rounds today and he was comfortably lying in bed, no acute events overnight. Patient did not have any new complaints this am. Patient was stable on the day of discharge. >35 mins spent in coordinating care and discharging the patient.     Review of Systems:  Complete 10 point review of system was obtained.  It is positive per HPI.  All other review of system was negative.  Physical Exam:  Vital Signs: Last Filed In 24 Hours Vital Signs: 24 Hour Range   BP: 104/59 (01/03 0822)  Temp: 36.7 ?C (98.1 ?F) (01/03 1610)  Pulse: 77 (01/03 9604)  Respirations: 18 PER MINUTE (01/03 0822)  SpO2: 99 % (01/03 0822) BP: (98-130)/(56-77)   Temp:  [36.3 ?C (97.3 ?F)-36.7 ?C (98.1 ?F)]   Pulse:  [69-103]   Respirations:  [18 PER MINUTE]   SpO2:  [96 %-100 %]           Constitutional: Alert and oriented times three. No acute distress. Answer questions appropriately.   HEENT: Normal conjunctivae. Pupils equal, round and reactive. Extraocular muscles are intact.  Neck: Supple. No thyroidmegaly. No carotid bruits. No jugular venous distension.   Cardiovascular: Irregular rhythm and rate. Normal S1 and S2. No murmurs, rubs, or gallop.   Respiratory: Breathing comfortable without use of accessory muscles. Breath sounds equal bilaterally. No crackles, wheezes, or rhonchi.   Gastrointestinal: Normal bowel sounds.  Not distended. No tenderness to palpation. No guarding or rebound. No organomegaly.   Musculoskeletal: No clubbing, cyanosis, or edema.     Lab/Radiology/Other Diagnostic Tests:  Results for orders placed or performed during the hospital encounter of 07/25/18 (from the past 24 hour(s)) POC GLUCOSE    Collection Time: 08/03/18  1:05 PM   Result Value Ref Range    Glucose, POC 177 (H) 70 - 100 MG/DL   POC GLUCOSE    Collection Time: 08/03/18  6:08 PM   Result Value Ref Range    Glucose, POC 124 (H) 70 - 100 MG/DL   POC GLUCOSE    Collection Time: 08/03/18 10:09 PM   Result Value Ref Range    Glucose, POC 165 (H) 70 - 100 MG/DL   CBC AND DIFF    Collection Time: 08/04/18  5:37 AM   Result Value Ref Range    White Blood Cells 8.4 4.5 - 11.0 K/UL    RBC 4.36 (L) 4.4 - 5.5 M/UL    Hemoglobin 12.5 (L) 13.5 - 16.5 GM/DL    Hematocrit 54.0 (L) 40 - 50 %    MCV 87.6 80 - 100 FL    MCH 28.6 26 - 34 PG    MCHC 32.7 32.0 - 36.0 G/DL    RDW 98.1 11 - 15 %    Platelet Count 310 150 - 400 K/UL    MPV 7.7 7 - 11 FL    Neutrophils 54 41 - 77 %    Lymphocytes 31 24 - 44 %    Monocytes 12 4 - 12 %    Eosinophils 2 0 - 5 %    Basophils 1 0 - 2 %    Absolute Neutrophil Count 4.60 1.8 - 7.0 K/UL    Absolute Lymph Count 2.60 1.0 - 4.8 K/UL    Absolute Monocyte Count 1.00 (H) 0 - 0.80 K/UL    Absolute Eosinophil Count 0.20 0 - 0.45 K/UL    Absolute Basophil Count 0.10 0 - 0.20 K/UL   POC GLUCOSE    Collection Time: 08/04/18  8:27 AM   Result Value Ref Range    Glucose, POC 169 (H) 70 - 100 MG/DL     No results found.    Sabino Snipes, MD

## 2018-08-06 ENCOUNTER — Encounter: Admit: 2018-08-06 | Discharge: 2018-08-06

## 2018-08-26 ENCOUNTER — Encounter: Admit: 2018-08-26 | Discharge: 2018-08-26

## 2018-08-28 ENCOUNTER — Encounter: Admit: 2018-08-28 | Discharge: 2018-08-28

## 2018-08-28 DIAGNOSIS — Z1211 Encounter for screening for malignant neoplasm of colon: Secondary | ICD-10-CM

## 2018-08-28 MED ORDER — SODIUM CHLORIDE 0.9 % IV SOLP
INTRAVENOUS | 0 refills | Status: CN
Start: 2018-08-28 — End: ?

## 2018-08-28 MED ORDER — METOPROLOL TARTRATE 100 MG PO TAB
ORAL_TABLET | Freq: Two times a day (BID) | ORAL | 0 refills | 90.00000 days | Status: AC
Start: 2018-08-28 — End: 2018-10-02

## 2018-08-29 ENCOUNTER — Ambulatory Visit: Admit: 2018-08-29 | Discharge: 2018-08-29

## 2018-08-29 ENCOUNTER — Encounter: Admit: 2018-08-29 | Discharge: 2018-08-29

## 2018-08-29 DIAGNOSIS — Z1211 Encounter for screening for malignant neoplasm of colon: Principal | ICD-10-CM

## 2018-08-31 ENCOUNTER — Ambulatory Visit: Admit: 2018-08-31 | Discharge: 2018-08-31

## 2018-08-31 ENCOUNTER — Encounter: Admit: 2018-08-31 | Discharge: 2018-08-31

## 2018-08-31 DIAGNOSIS — G4733 Obstructive sleep apnea (adult) (pediatric): Secondary | ICD-10-CM

## 2018-08-31 DIAGNOSIS — N200 Calculus of kidney: Secondary | ICD-10-CM

## 2018-08-31 DIAGNOSIS — Z9229 Personal history of other drug therapy: Secondary | ICD-10-CM

## 2018-08-31 DIAGNOSIS — Z87442 Personal history of urinary calculi: Secondary | ICD-10-CM

## 2018-08-31 DIAGNOSIS — I4891 Unspecified atrial fibrillation: Secondary | ICD-10-CM

## 2018-08-31 DIAGNOSIS — Q639 Congenital malformation of kidney, unspecified: Secondary | ICD-10-CM

## 2018-08-31 DIAGNOSIS — H269 Unspecified cataract: Secondary | ICD-10-CM

## 2018-08-31 DIAGNOSIS — Z9889 Other specified postprocedural states: Secondary | ICD-10-CM

## 2018-08-31 DIAGNOSIS — E119 Type 2 diabetes mellitus without complications: Secondary | ICD-10-CM

## 2018-08-31 DIAGNOSIS — I10 Essential (primary) hypertension: Secondary | ICD-10-CM

## 2018-08-31 DIAGNOSIS — I517 Cardiomegaly: Secondary | ICD-10-CM

## 2018-08-31 DIAGNOSIS — E669 Obesity, unspecified: Secondary | ICD-10-CM

## 2018-09-28 ENCOUNTER — Encounter: Admit: 2018-09-28 | Discharge: 2018-09-28

## 2018-09-30 ENCOUNTER — Encounter: Admit: 2018-09-30 | Discharge: 2018-09-30

## 2018-10-02 MED ORDER — METOPROLOL TARTRATE 100 MG PO TAB
150 mg | ORAL_TABLET | Freq: Two times a day (BID) | ORAL | 1 refills | 90.00000 days | Status: AC
Start: 2018-10-02 — End: 2019-03-28

## 2018-12-06 ENCOUNTER — Encounter: Admit: 2018-12-06 | Discharge: 2018-12-06

## 2018-12-08 ENCOUNTER — Encounter: Admit: 2018-12-08 | Discharge: 2018-12-08

## 2018-12-11 ENCOUNTER — Ambulatory Visit: Admit: 2018-12-11 | Discharge: 2018-12-11 | Payer: Commercial Managed Care - PPO

## 2018-12-11 ENCOUNTER — Encounter: Admit: 2018-12-11 | Discharge: 2018-12-11

## 2018-12-11 DIAGNOSIS — Z0389 Encounter for observation for other suspected diseases and conditions ruled out: ICD-10-CM

## 2018-12-11 DIAGNOSIS — I4891 Unspecified atrial fibrillation: ICD-10-CM

## 2018-12-11 DIAGNOSIS — Z9889 Other specified postprocedural states: Secondary | ICD-10-CM

## 2018-12-11 DIAGNOSIS — E119 Type 2 diabetes mellitus without complications: ICD-10-CM

## 2018-12-11 DIAGNOSIS — E669 Obesity, unspecified: ICD-10-CM

## 2018-12-11 DIAGNOSIS — I10 Essential (primary) hypertension: ICD-10-CM

## 2018-12-11 DIAGNOSIS — G4733 Obstructive sleep apnea (adult) (pediatric): ICD-10-CM

## 2018-12-11 DIAGNOSIS — Z87442 Personal history of urinary calculi: ICD-10-CM

## 2018-12-11 DIAGNOSIS — H269 Unspecified cataract: ICD-10-CM

## 2018-12-11 DIAGNOSIS — Z9229 Personal history of other drug therapy: ICD-10-CM

## 2018-12-11 DIAGNOSIS — I517 Cardiomegaly: ICD-10-CM

## 2018-12-11 DIAGNOSIS — I480 Paroxysmal atrial fibrillation: Principal | ICD-10-CM

## 2018-12-11 NOTE — Progress Notes
Request for the following medical records, for the purpose Continuity of Care.     Please send the following:     Recent Labs     Please Fax to:   FAX#: [phone]  Attn: Cordelia Pen

## 2018-12-28 ENCOUNTER — Encounter: Admit: 2018-12-28 | Discharge: 2018-12-28

## 2018-12-28 MED ORDER — LISINOPRIL 10 MG PO TAB
ORAL_TABLET | Freq: Every day | 2 refills | Status: DC
Start: 2018-12-28 — End: 2019-10-03

## 2019-03-01 ENCOUNTER — Encounter: Admit: 2019-03-01 | Discharge: 2019-03-01

## 2019-03-28 ENCOUNTER — Encounter: Admit: 2019-03-28 | Discharge: 2019-03-28

## 2019-03-28 MED ORDER — METOPROLOL TARTRATE 100 MG PO TAB
ORAL_TABLET | Freq: Two times a day (BID) | ORAL | 0 refills | 90.00000 days | Status: DC
Start: 2019-03-28 — End: 2019-07-03

## 2019-07-03 ENCOUNTER — Encounter: Admit: 2019-07-03 | Discharge: 2019-07-03

## 2019-07-03 MED ORDER — METOPROLOL TARTRATE 100 MG PO TAB
ORAL_TABLET | Freq: Two times a day (BID) | ORAL | 0 refills | 90.00000 days | Status: DC
Start: 2019-07-03 — End: 2019-09-24

## 2019-09-24 ENCOUNTER — Encounter: Admit: 2019-09-24 | Discharge: 2019-09-24

## 2019-09-24 MED ORDER — METOPROLOL TARTRATE 100 MG PO TAB
ORAL_TABLET | Freq: Two times a day (BID) | ORAL | 0 refills | 90.00000 days | Status: DC
Start: 2019-09-24 — End: 2020-01-01

## 2019-10-03 ENCOUNTER — Encounter: Admit: 2019-10-03 | Discharge: 2019-10-03

## 2019-10-03 DIAGNOSIS — I10 Essential (primary) hypertension: Secondary | ICD-10-CM

## 2019-10-03 MED ORDER — LISINOPRIL 10 MG PO TAB
ORAL_TABLET | Freq: Every day | 0 refills | Status: DC
Start: 2019-10-03 — End: 2020-01-01

## 2019-10-25 ENCOUNTER — Encounter: Admit: 2019-10-25 | Discharge: 2019-10-25

## 2019-10-25 DIAGNOSIS — I10 Essential (primary) hypertension: Secondary | ICD-10-CM

## 2019-10-25 LAB — BASIC METABOLIC PANEL
Lab: 1 10*3/uL (ref 1.8–7.0)
Lab: 104 % (ref 4–12)
Lab: 109 10*3/uL (ref 1.0–4.8)
Lab: 137 % (ref 41–77)
Lab: 21 % — ABNORMAL LOW (ref 22–29)
Lab: 3.9 % (ref 24–44)
Lab: 33 % (ref 0–2)
Lab: 74 K/UL (ref 0–0.45)
Lab: 9.6 K/UL (ref 0–0.80)

## 2019-12-03 ENCOUNTER — Encounter: Admit: 2019-12-03 | Discharge: 2019-12-03

## 2019-12-03 NOTE — Telephone Encounter
-----   Message from Chrissie Noa Feliz Beam) Alger Simons, MD sent at 12/03/2019 11:56 AM CDT -----  Molli Knock for now.  No changes.Thanks!  ----- Message -----  From: Rosezena Sensor, BSN  Sent: 11/28/2019  10:34 AM CDT  To: Mauri Pole) Alger Simons, MD    FLP requested from OV. Pt having nuc today

## 2020-01-01 ENCOUNTER — Encounter: Admit: 2020-01-01 | Discharge: 2020-01-01

## 2020-01-01 MED ORDER — LISINOPRIL 10 MG PO TAB
10 mg | ORAL_TABLET | Freq: Every day | ORAL | 1 refills | Status: AC
Start: 2020-01-01 — End: ?

## 2020-01-01 MED ORDER — METOPROLOL TARTRATE 100 MG PO TAB
ORAL_TABLET | Freq: Two times a day (BID) | ORAL | 3 refills | 90.00000 days | Status: DC
Start: 2020-01-01 — End: 2020-01-02

## 2020-01-02 ENCOUNTER — Encounter: Admit: 2020-01-02 | Discharge: 2020-01-02

## 2020-01-02 MED ORDER — METOPROLOL TARTRATE 100 MG PO TAB
ORAL_TABLET | Freq: Two times a day (BID) | ORAL | 0 refills | 90.00000 days | Status: AC
Start: 2020-01-02 — End: ?

## 2020-01-03 ENCOUNTER — Encounter: Admit: 2020-01-03 | Discharge: 2020-01-03

## 2020-01-03 MED ORDER — LISINOPRIL 10 MG PO TAB
ORAL_TABLET | Freq: Every day | 0 refills
Start: 2020-01-03 — End: ?

## 2020-02-18 ENCOUNTER — Encounter: Admit: 2020-02-18 | Discharge: 2020-02-18

## 2020-02-18 NOTE — Telephone Encounter
Received request for cardiac clearance and guidance re holding Eliquis prior to surgery.  Patient scheduled for a left TKR on 04/15/20 by Dr Jackquline Berlin with [facility].  Phone contact:  [phone]   FAX:  [phone]    Last OV with [facility]  on 11/21/19; Regadenoson MPI 11/29/19. Prior cardiac clearance note on 12/03/19 for prior Right TKR.    Will forward to [facility] to review and advise re cardiac clearance and recommendations for holding Eliquis prior to surgery

## 2020-05-14 ENCOUNTER — Encounter: Admit: 2020-05-14 | Discharge: 2020-05-14

## 2020-05-16 ENCOUNTER — Encounter: Admit: 2020-05-16 | Discharge: 2020-05-16

## 2020-05-16 DIAGNOSIS — E669 Obesity, unspecified: Secondary | ICD-10-CM

## 2020-05-16 DIAGNOSIS — Z9889 Other specified postprocedural states: Secondary | ICD-10-CM

## 2020-05-16 DIAGNOSIS — I517 Cardiomegaly: Secondary | ICD-10-CM

## 2020-05-16 DIAGNOSIS — I10 Essential (primary) hypertension: Secondary | ICD-10-CM

## 2020-05-16 DIAGNOSIS — I480 Paroxysmal atrial fibrillation: Secondary | ICD-10-CM

## 2020-05-16 DIAGNOSIS — I4891 Unspecified atrial fibrillation: Secondary | ICD-10-CM

## 2020-05-16 DIAGNOSIS — H269 Unspecified cataract: Secondary | ICD-10-CM

## 2020-05-16 DIAGNOSIS — Z9229 Personal history of other drug therapy: Secondary | ICD-10-CM

## 2020-05-16 DIAGNOSIS — E119 Type 2 diabetes mellitus without complications: Secondary | ICD-10-CM

## 2020-05-16 DIAGNOSIS — G4733 Obstructive sleep apnea (adult) (pediatric): Secondary | ICD-10-CM

## 2020-05-16 DIAGNOSIS — Z87442 Personal history of urinary calculi: Secondary | ICD-10-CM

## 2020-05-18 ENCOUNTER — Encounter: Admit: 2020-05-18 | Discharge: 2020-05-18

## 2020-05-18 DIAGNOSIS — Z9229 Personal history of other drug therapy: Secondary | ICD-10-CM

## 2020-05-18 DIAGNOSIS — E119 Type 2 diabetes mellitus without complications: Secondary | ICD-10-CM

## 2020-05-18 DIAGNOSIS — E669 Obesity, unspecified: Secondary | ICD-10-CM

## 2020-05-18 DIAGNOSIS — I517 Cardiomegaly: Secondary | ICD-10-CM

## 2020-05-18 DIAGNOSIS — I10 Essential (primary) hypertension: Secondary | ICD-10-CM

## 2020-05-18 DIAGNOSIS — Z9889 Other specified postprocedural states: Secondary | ICD-10-CM

## 2020-05-18 DIAGNOSIS — I4891 Unspecified atrial fibrillation: Secondary | ICD-10-CM

## 2020-05-18 DIAGNOSIS — H269 Unspecified cataract: Secondary | ICD-10-CM

## 2020-05-18 DIAGNOSIS — Z87442 Personal history of urinary calculi: Secondary | ICD-10-CM

## 2020-05-18 DIAGNOSIS — G4733 Obstructive sleep apnea (adult) (pediatric): Secondary | ICD-10-CM

## 2020-06-30 ENCOUNTER — Encounter: Admit: 2020-06-30 | Discharge: 2020-06-30

## 2020-06-30 MED ORDER — LISINOPRIL 10 MG PO TAB
ORAL_TABLET | Freq: Every day | 1 refills | Status: AC
Start: 2020-06-30 — End: ?

## 2020-09-29 ENCOUNTER — Encounter: Admit: 2020-09-29 | Discharge: 2020-09-29

## 2020-09-29 NOTE — Telephone Encounter
-----   Message from Golda Acre, LPN sent at 9/70/2637  1:57 PM CST -----  Regarding: RAD- CC  VM on triage line from [place] with Dr. Broadus John office [phone].  Sid that they need cardiac clearance for colonoscopy and 2 day hold on Eliquis.

## 2020-09-29 NOTE — Telephone Encounter
LEFT MESSAGE for Mr Jeremy Bean to call back       Does he feel he has been in atrial fibrillation since his last appt with Dr Wallene Huh last december?       Is there a date scheduled for colonoscopy?       Has he bridged before?  Incomplete note below, can complete when questions above are answered       [name], DOB 07-Aug-1965,  is scheduled for Colonoscopy by Dr Broadus John who is requesting cardiac clearance.   Dr. Broadus John is requesting a 2 day hold of apixaban (Eliquis).     HE is currently on apixaban (Eliquis) as noted in Dr Johnanna Schneiders last office visit in October 2021 -        Paroxysmal atrial fibrillation progressed to persistent atrial fibrillation.  History of prior ablation 2009 and flutter ablation in 2010.  He had recurrent atrial fibrillation in December 2019 with embolic infarct of left kidney.  He was anticoagulated with Eliquis and subsequently underwent cardioversion with flecainide initiation and stayed in sinus rhythm since then.         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

## 2020-10-06 ENCOUNTER — Encounter: Admit: 2020-10-06 | Discharge: 2020-10-06

## 2020-10-06 NOTE — Telephone Encounter
-----   Message from Golda Acre, LPN sent at 03/03/1571  2:45 PM CST -----  Regarding: RAD- hold Eliqusi  VM on triage line from Rachael with Dr. Broadus John office in [place] [phone].  York Spaniel that this is her 3rd call to get clearance to hold Eliquis 2 days prior to colonoscopy.   Fax note to #[phone].

## 2020-12-28 ENCOUNTER — Encounter: Admit: 2020-12-28 | Discharge: 2020-12-28

## 2020-12-28 MED ORDER — LISINOPRIL 10 MG PO TAB
ORAL_TABLET | Freq: Every day | 0 refills
Start: 2020-12-28 — End: ?

## 2021-03-28 ENCOUNTER — Encounter: Admit: 2021-03-28 | Discharge: 2021-03-28

## 2021-03-28 MED ORDER — LISINOPRIL 10 MG PO TAB
ORAL_TABLET | Freq: Every day | 0 refills
Start: 2021-03-28 — End: ?

## 2021-06-24 ENCOUNTER — Encounter: Admit: 2021-06-24 | Discharge: 2021-06-24 | Payer: BC Managed Care – PPO

## 2021-06-24 DIAGNOSIS — R0989 Other specified symptoms and signs involving the circulatory and respiratory systems: Secondary | ICD-10-CM

## 2021-06-24 DIAGNOSIS — H269 Unspecified cataract: Secondary | ICD-10-CM

## 2021-06-24 DIAGNOSIS — Z9229 Personal history of other drug therapy: Secondary | ICD-10-CM

## 2021-06-24 DIAGNOSIS — E669 Obesity, unspecified: Secondary | ICD-10-CM

## 2021-06-24 DIAGNOSIS — I4891 Unspecified atrial fibrillation: Secondary | ICD-10-CM

## 2021-06-24 DIAGNOSIS — Z9889 Other specified postprocedural states: Secondary | ICD-10-CM

## 2021-06-24 DIAGNOSIS — E119 Type 2 diabetes mellitus without complications: Secondary | ICD-10-CM

## 2021-06-24 DIAGNOSIS — I517 Cardiomegaly: Secondary | ICD-10-CM

## 2021-06-24 DIAGNOSIS — I10 Essential (primary) hypertension: Secondary | ICD-10-CM

## 2021-06-24 DIAGNOSIS — Z87442 Personal history of urinary calculi: Secondary | ICD-10-CM

## 2021-06-24 DIAGNOSIS — G4733 Obstructive sleep apnea (adult) (pediatric): Secondary | ICD-10-CM

## 2021-06-24 MED ORDER — METOPROLOL TARTRATE 100 MG PO TAB
100 mg | ORAL_TABLET | Freq: Two times a day (BID) | ORAL | 3 refills | 90.00000 days | Status: AC
Start: 2021-06-24 — End: ?

## 2021-06-24 NOTE — Patient Instructions
Decrease metoprolol to 100mg  twice daily.

## 2021-06-28 ENCOUNTER — Encounter: Admit: 2021-06-28 | Discharge: 2021-06-28 | Payer: BC Managed Care – PPO

## 2021-06-28 DIAGNOSIS — G4733 Obstructive sleep apnea (adult) (pediatric): Secondary | ICD-10-CM

## 2021-06-28 DIAGNOSIS — I10 Essential (primary) hypertension: Secondary | ICD-10-CM

## 2021-06-28 DIAGNOSIS — I4891 Unspecified atrial fibrillation: Secondary | ICD-10-CM

## 2021-06-28 DIAGNOSIS — H269 Unspecified cataract: Secondary | ICD-10-CM

## 2021-06-28 DIAGNOSIS — Z87442 Personal history of urinary calculi: Secondary | ICD-10-CM

## 2021-06-28 DIAGNOSIS — I517 Cardiomegaly: Secondary | ICD-10-CM

## 2021-06-28 DIAGNOSIS — Z9889 Other specified postprocedural states: Secondary | ICD-10-CM

## 2021-06-28 DIAGNOSIS — Z9229 Personal history of other drug therapy: Secondary | ICD-10-CM

## 2021-06-28 DIAGNOSIS — E119 Type 2 diabetes mellitus without complications: Secondary | ICD-10-CM

## 2021-06-28 DIAGNOSIS — E669 Obesity, unspecified: Secondary | ICD-10-CM

## 2021-06-29 ENCOUNTER — Encounter: Admit: 2021-06-29 | Discharge: 2021-06-29 | Payer: BC Managed Care – PPO

## 2021-06-29 MED ORDER — LISINOPRIL 10 MG PO TAB
ORAL_TABLET | Freq: Every day | 0 refills | Status: AC
Start: 2021-06-29 — End: ?

## 2021-06-29 NOTE — Progress Notes
URGENT REQUEST FROM [facility]     STAT REQUEST FOR MEDICAL RECORDS FOR:     Jeremy Bean DOB 01-18-1966      PLEASE FAX:                              MOST RECENT LAB RESULTS (2022)               PLEASE FAX TO [phone]  ATTENTION:  White Nurse Team     [facility] YOU!

## 2021-06-29 NOTE — Telephone Encounter
Refill approved for 90-days only. Requested recent labs from PCP, if no recent labs received. Will reach out to patient to have these completed.

## 2021-09-30 ENCOUNTER — Encounter: Admit: 2021-09-30 | Discharge: 2021-09-30 | Payer: BC Managed Care – PPO

## 2021-09-30 MED ORDER — LISINOPRIL 10 MG PO TAB
ORAL_TABLET | 0 refills | Status: AC
Start: 2021-09-30 — End: ?

## 2021-09-30 NOTE — Telephone Encounter
Cr and K flagged abnormal with refill protocol.  After review Cr and K+ results are within range for pt  Refill request approved

## 2021-12-21 ENCOUNTER — Encounter: Admit: 2021-12-21 | Discharge: 2021-12-21 | Payer: BC Managed Care – PPO

## 2021-12-21 MED ORDER — LISINOPRIL 10 MG PO TAB
ORAL_TABLET | 0 refills
Start: 2021-12-21 — End: ?

## 2021-12-22 ENCOUNTER — Encounter: Admit: 2021-12-22 | Discharge: 2021-12-22 | Payer: BC Managed Care – PPO

## 2022-01-25 ENCOUNTER — Encounter: Admit: 2022-01-25 | Discharge: 2022-01-25 | Payer: BC Managed Care – PPO

## 2022-01-25 NOTE — Telephone Encounter
Jeremy Bean   [birth year]    Patient is scheduled for colonscopy on 7/31 with Dr. Broadus John. Dr. Broadus John is requesting a 2 day hold of Eliquis.     Patient is currently on Eliquis for history of AF.  At his last OV, his heart rhythm was SR.    Chadsvasc =1 (HTN).    Is patient OK to hold Eliquis for 2 days?      Irving Burton asked that we fax them when we receive a response: fax # [phone]

## 2022-01-25 NOTE — Telephone Encounter
-----   Message from Romilda Joy, [place] sent at 01/22/2022  5:19 PM CDT -----  Regarding: FW: RAD- hold Eliquis  Called back and phone rang and rang. Never had  a VM.     Need to ask what the procedure is and when it is. Does he also need CC?    ----- Message -----  From: Golda Acre, LPN  Sent: 04/22/1940   3:24 PM CDT  To: Cvm Nurse Ep Team C  Subject: RAD- hold Eliquis                                VM on triage line from [facility] with Dr. Broadus John office in [place] at 2:46pm.  York Spaniel that they need 2 day hold on Eliquis before procedure.  That is all she said.  Call her at #[phone].

## 2022-03-02 ENCOUNTER — Encounter: Admit: 2022-03-02 | Discharge: 2022-03-02 | Payer: BC Managed Care – PPO

## 2022-06-04 ENCOUNTER — Encounter: Admit: 2022-06-04 | Discharge: 2022-06-04 | Payer: BC Managed Care – PPO

## 2022-06-04 MED ORDER — METOPROLOL TARTRATE 100 MG PO TAB
100 mg | ORAL_TABLET | Freq: Two times a day (BID) | ORAL | 0 refills | 90.00000 days | Status: AC
Start: 2022-06-04 — End: ?

## 2022-06-18 ENCOUNTER — Encounter: Admit: 2022-06-18 | Discharge: 2022-06-18 | Payer: BC Managed Care – PPO

## 2022-06-18 MED ORDER — LISINOPRIL 10 MG PO TAB
ORAL_TABLET | 0 refills | Status: AC
Start: 2022-06-18 — End: ?

## 2022-06-23 ENCOUNTER — Encounter: Admit: 2022-06-23 | Discharge: 2022-06-23 | Payer: BC Managed Care – PPO

## 2022-06-23 MED ORDER — METOPROLOL TARTRATE 100 MG PO TAB
100 mg | ORAL_TABLET | Freq: Two times a day (BID) | ORAL | 0 refills | 90.00000 days | Status: AC
Start: 2022-06-23 — End: ?

## 2022-09-02 ENCOUNTER — Encounter: Admit: 2022-09-02 | Discharge: 2022-09-02 | Payer: BC Managed Care – PPO

## 2022-09-18 ENCOUNTER — Encounter: Admit: 2022-09-18 | Discharge: 2022-09-18 | Payer: BC Managed Care – PPO

## 2022-09-18 MED ORDER — LISINOPRIL 10 MG PO TAB
ORAL_TABLET | 0 refills
Start: 2022-09-18 — End: ?

## 2022-09-29 ENCOUNTER — Ambulatory Visit: Admit: 2022-09-29 | Discharge: 2022-09-29 | Payer: BC Managed Care – PPO

## 2022-09-29 ENCOUNTER — Encounter: Admit: 2022-09-29 | Discharge: 2022-09-29 | Payer: BC Managed Care – PPO

## 2022-09-29 DIAGNOSIS — Z9229 Personal history of other drug therapy: Secondary | ICD-10-CM

## 2022-09-29 DIAGNOSIS — I517 Cardiomegaly: Secondary | ICD-10-CM

## 2022-09-29 DIAGNOSIS — R0989 Other specified symptoms and signs involving the circulatory and respiratory systems: Secondary | ICD-10-CM

## 2022-09-29 DIAGNOSIS — G4733 Obstructive sleep apnea (adult) (pediatric): Secondary | ICD-10-CM

## 2022-09-29 DIAGNOSIS — I10 Essential (primary) hypertension: Secondary | ICD-10-CM

## 2022-09-29 DIAGNOSIS — I4891 Unspecified atrial fibrillation: Secondary | ICD-10-CM

## 2022-09-29 DIAGNOSIS — Z9889 Other specified postprocedural states: Secondary | ICD-10-CM

## 2022-09-29 DIAGNOSIS — I480 Paroxysmal atrial fibrillation: Secondary | ICD-10-CM

## 2022-09-29 DIAGNOSIS — Z87442 Personal history of urinary calculi: Secondary | ICD-10-CM

## 2022-09-29 DIAGNOSIS — H269 Unspecified cataract: Secondary | ICD-10-CM

## 2022-09-29 DIAGNOSIS — E119 Type 2 diabetes mellitus without complications: Secondary | ICD-10-CM

## 2022-09-29 DIAGNOSIS — E669 Obesity, unspecified: Secondary | ICD-10-CM

## 2022-09-29 MED ORDER — FLECAINIDE 100 MG PO TAB
150 mg | ORAL_TABLET | Freq: Two times a day (BID) | ORAL | 1 refills | 30.00000 days | Status: AC
Start: 2022-09-29 — End: ?
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
## 2022-09-29 NOTE — Patient Instructions
Follow-Up:    -Thank you for allowing me to take care of you today. My name is Claudina Lick, BSN.    -We would like you to follow up in   3 months with Dr. Adella Hare Dendi or Sharman Cheek, APRN   The schedule is released approximately 4-5 months in advance. You should be called or mailed to make an appointment, however if you would like to call us to make this appt, please call [phone].      -You will receive a survey in the upcoming week from [facility] [place] [facility]. Your feedback is important to [place], and helps [place] continue to improve patient care and patient satisfaction.     Notes From Today's Office Visit    I will call you tomorrow to confirm Cardioversion date.      Schedule Stress Test    Increase Flecainide to 150 mg twice daily    Contacting our office:    -For NON-URGENT questions please contact us through your MyChart account.   -For all medication refills please contact your pharmacy or send a request through MyChart.     -For all questions that may need to be addressed urgently please call the nursing triage line at [phone] Monday - Friday 8-5 only. Please leave a detailed message with your name, date of birth, and reason for your call.  As long as you call before 3:30pm, you will receive a call back the same day. Please allow time for [place] to review your chart prior to call back.     -Our fax number is [phone].    -Should you have an urgent concern over the weekend/nights that you do not believe warrants a visit to the emergency room, please contact [phone] for our on-call team.    Results & Testing Follow Up:    -Please allow 10-15 business days for the results of any testing to be reviewed. Please call our office if you have not heard from a nurse within this time frame.    -Should you choose to complete testing at an outside facility, please contact our office after completion of testing so that we can ensure that we have received results.    Lab and test results: As a part of the CARES act, starting 11/01/2019, some results will be released to you via mychart immediately and automatically.  You may see results before your provider sees them; however, your provider will review all these results and then they, or one of their team, will notify you of result information and recommendations.   Critical results will be addressed immediately, but otherwise, please allow [place] time to get back with you prior to you reaching out to [place] for questions.  This will usually take about 72 hours for labs and 5-7 days for procedure test results.      We know you have a choice and want to thank you for choosing [facility].

## 2022-09-30 ENCOUNTER — Encounter: Admit: 2022-09-30 | Discharge: 2022-09-30 | Payer: BC Managed Care – PPO

## 2022-09-30 DIAGNOSIS — I480 Paroxysmal atrial fibrillation: Secondary | ICD-10-CM

## 2022-09-30 NOTE — Patient Instructions
Transesophageal Echocardiogram and/or Cardioversion  Patient Pre-Procedure Instructions    Patient Name: Jeremy Bean  MRN#: [id number]  Date of Birth: 1965-10-20 (57 y.o.)  Today's Date: 09/30/2022    PROCEDURE:  You are scheduled for a Cardioversion.    PROCEDURE DATE AND ARRIVAL TIME:    Your procedure date is Thurs 3/14.    Your procedure time is 10:30 AM with an arrival time of 9:30 AM.    Please arrive 1 hour prior to your appointment time.    Please check in at [facility] ([id number]).  Address:  [address], [place], [place] [address]    (Enter main hospital entrance.  The cardiology clinic is directly to the right of the information desk)    PRE-PROCEDURE APPOINTMENTS:      Completed on 2/28   Office visit to update history and physical (requirement within 30 days of procedure)         FOOD AND DRINK INSTRUCTIONS  Do not eat or drink after midnight before your procedure, including gum, mints, smokeless tobacco, communion. You can take sips of water with your medications.    MEDICATION INSTRUCTIONS    7 DAYS BEFORE PROCEDURE  Hold all GLP-1 Agonists.  DO NOT TAKE the following:     Tirzepatide (Mounjaro, Zepbound) - Do not take Tirzepatide (Mounjaro, Zepbound) within 7 days of procedure.  Last dose on Fri 3/8.      HOLD AM OF PROCEDURE    Diuretics: Hydrochlorothiazide -- hold the morning of your procedure  Oral diabetic medications: Metformin -- hold the morning of your procedure.  Marcelline Deist -- hold the morning of your procedure.   Over-the-counter vitamins and supplements      TAKE MORNING OF PROCEDURE with a sip of water    Blood thinner: DO NOT miss any doses of Eliquis.  If you miss any doses, please notify our office as soon as possible.    All other prescribed medications      Additional Instructions  If you have an implanted nerve stimulator device, please bring the controller.  Your device will be turned off prior to cardioversion.    Bring photo ID and your health insurance card(s).    Arrange for a driver to take you home from the hospital. Please arrange for a friend or family member to take you home from this procedure. You cannot take a taxi, Benedetto Goad or public transportation, as there must be a responsible person to help care for you after sedation.    Bring an accurate list of your current medications with you to the hospital (all medications and supplements taken daily).  Please use the medication list below and write in the date and time when you took your last dose before your procedure. Update this list of medications as needed.      Wear comfortable clothes and don't bring valuables, other than photo identification card, with you to the hospital.    Do not apply lotions or oils (ex. Vaseline, baby oil, face lotion) to chest/stomach day of procedure.    Please review your pre-procedure instructions and bring them with you on the day of your procedure. Call the procedure nurse office at [phone] with any questions between the hours of 7 a.m.- 5 p.m. Monday through Friday.      CURRENT MEDICATIONS  Outpatient Encounter Medications as of 09/30/2022   Medication Sig Dispense Refill    apixaban (ELIQUIS) 5 mg tablet Take one tablet by  mouth twice daily. 60 tablet 1    escitalopram oxalate (LEXAPRO) 10 mg tablet Take one tablet by mouth daily.      FARXIGA 5 mg tablet Take one tablet by mouth daily.      flecainide (TAMBOCOR) 100 mg tablet Take 1.5 tablets by mouth twice daily. 60 tablet 1    hydroCHLOROthiazide (HYDRODIURIL) 25 mg tablet Take one tablet by mouth daily.      lisinopriL (ZESTRIL) 10 mg tablet Take 1 tablet by mouth once daily 90 tablet 1    metFORMIN (GLUCOPHAGE) 1,000 mg tablet Take one tablet by mouth twice daily with meals.      metoprolol tartrate (LOPRESSOR) 100 mg tablet Take one tablet by mouth twice daily. 180 tablet 0    pioglitazone (ACTOS) 30 mg tablet Take one tablet by mouth daily.      tirzepatide (MOUNJARO) 10 mg/0.5 mL injector PEN Inject 0.5 mL under the skin every 7 days.      traMADoL (ULTRAM) 50 mg tablet Take one tablet by mouth as Needed.       No facility-administered encounter medications on file as of 09/30/2022.       _________________________________________  Form completed by: Claudina Lick, BSN  Date completed: 09/30/22  Method: Via telephone and mailed to the patient.

## 2022-10-06 ENCOUNTER — Encounter: Admit: 2022-10-06 | Discharge: 2022-10-06 | Payer: BC Managed Care – PPO

## 2022-10-06 NOTE — Progress Notes
Report Sheet    DCCV           Indication: atrial fibrillation  Ordering Provider: Dr. Wallene Huh    Name: Jeremy Bean     Age: 57 y.o.    DOB: 1966/03/04     MRN: [id number]  Patient phone number: [phone]    Communication Barriers: none    Lab(s) needed: EKG and POC K  Other: none    Device check needed: NO  Device: No results found for: GENERATOR, EPDEVTYP    Other implanted devices/pumps: none  Has Controller (pt to bring): NA    Pt Called: yes  Arrival Time: 9:30 am  Driver Information: wife, Velna Hatchet     Date of Last H&P: 09/29/2022  Additional Information: none    Prior TEE: 07/31/2018   Prior DCCV: 08/03/2018      Prior Echo:  07/27/2018    Previous TEE/DCCV details: 200 J times 1.....successful SR    EF:   ECHO EF   Date Value Ref Range Status   07/31/2018 60 % Final       Anticoag: Eliquis     Dose: 5 mg     Frequency: BID     Missed: none  Labs   INR    HGB    Platelets    Glucose    NA    K    Creatinine    Other      Medications to HOLD day of:  Vitamins/Supplements, metformin, actos and stop taking Mounjaro today. Pt states he has not taken Mounjaro since 10/01/2022    Probe   Gastric Surgery    GERD    Swallow difficulty    Head/Neck Surg/Rad    Chest Surg/Rad    EGD    Varices/Esophag CA    GI bleed    Dental issues      Sedation   COPD    OSA/CPAP    Asthma    Pulm HTN    Anesthesia Issues    Smoker    ETOH & Frequency    Drug Use    Chronic Pain Med    GLP-1 Last dose:     Current Medications:    apixaban (ELIQUIS) 5 mg tablet Take one tablet by mouth twice daily.    escitalopram oxalate (LEXAPRO) 10 mg tablet Take one tablet by mouth daily.    FARXIGA 5 mg tablet Take one tablet by mouth daily.    flecainide (TAMBOCOR) 100 mg tablet Take 1.5 tablets by mouth twice daily.    hydroCHLOROthiazide (HYDRODIURIL) 25 mg tablet Take one tablet by mouth daily.    lisinopriL (ZESTRIL) 10 mg tablet Take 1 tablet by mouth once daily    metFORMIN (GLUCOPHAGE) 1,000 mg tablet Take one tablet by mouth twice daily with meals.    metoprolol tartrate (LOPRESSOR) 100 mg tablet Take one tablet by mouth twice daily.    pioglitazone (ACTOS) 30 mg tablet Take one tablet by mouth daily.    tirzepatide (MOUNJARO) 10 mg/0.5 mL injector PEN Inject 0.5 mL under the skin every 7 days.    traMADoL (ULTRAM) 50 mg tablet Take one tablet by mouth as Needed.       Past Medical/Surgical History:   Patient Active Problem List    Diagnosis Date Noted    Atrial fibrillation (HCC) 07/26/2018    Abdominal pain 07/26/2018    OSA on CPAP 12/31/2013    Obstructive sleep apnea 06/05/2009    History of rotator cuff  surgery 06/05/2009    S/P appendectomy 06/05/2009    History of hernia repair 06/05/2009    Coronary artery disease (CAD) excluded 06/05/2009    Enlarged LA (left atrium) 06/05/2009    AF (atrial fibrillation) (HCC) 10/24/2007    Hypertension 10/24/2007      Past Medical History:   Diagnosis Date    Atrial fibrillation (HCC) 2006    Cataract     Diabetes (HCC)     Enlarged LA (left atrium) 06/05/2009    History of hernia repair 06/05/2009    History of renal calculi 2009    Bilateral    History of rotator cuff surgery 06/05/2009    HTN (hypertension)     HX: anticoagulation     Obesity     Obstructive sleep apnea 06/05/2009      Surgical History:   Procedure Laterality Date    HX HEART CATHETERIZATION  2008    CARDIOVASCULAR STRESS TEST  11/2017    This study is mildly abnormal qualitatively.  There is subtle ischemia perhaps in a diagonal and in the right coronary artery territory.    ESOPHAGOSCOPY WITH ENDOSCOPIC ULTRASOUND EXAMINATION - FLEXIBLE N/A 07/28/2018    Performed by Vertell Novak, MD at [facility] ENDO    DOPPLER ECHOCARDIOGRAPHY      ELECTROCARDIOGRAM      EVENT MONITOR      HX APPENDECTOMY      TRANSESOPHAGEAL ECHO         Allergies: No Known Allergies  Bennie Dallas, RN

## 2022-10-06 NOTE — Telephone Encounter
Called and spoke to pt in regards to cardioversion scheduled for Thursday 3/14. Following instructions given and pt verbalized all understanding.    You are scheduled to have a cardioversion on Thursday 3/14 at 10:30 am.     Please check in at the Cardiovascular Medicine clinic at 9;30 am. We are located to the right of the front entrance by the information desk. Wear comfortable clothing, no oils or lotions to  your chest, limit wearing any metal and if you have any hair on your chest we will have to shave it off.     Please have nothing to eat or drink after midnight on Wednesday night. No gum, mints, candy or tobacco the morning of your procedure. Ok to have sips of water with your morning medications, holding any vitamins or supplements, hold Actos, metformin and do not take your Mounjaro 1 week prior to procedure. Pt states he has not taken it since March 1st. Do not miss your dose of Eliquis the morning of your procedure. Pt stated he has not missed any doses of his Eliquis in the past 3 weeks.    You will be receiving sedation during your procedure and will need to have a responsible adult with you to drive you home after you procedure. Pt states his wife, Freda Munro, will be bringing him and taking him home from the procedure.

## 2022-10-13 ENCOUNTER — Encounter: Admit: 2022-10-13 | Discharge: 2022-10-13 | Payer: BC Managed Care – PPO

## 2022-10-14 ENCOUNTER — Ambulatory Visit: Admit: 2022-10-14 | Discharge: 2022-10-14 | Payer: BC Managed Care – PPO

## 2022-10-14 ENCOUNTER — Encounter: Admit: 2022-10-14 | Discharge: 2022-10-14 | Payer: BC Managed Care – PPO

## 2022-10-14 DIAGNOSIS — I480 Paroxysmal atrial fibrillation: Secondary | ICD-10-CM

## 2022-10-28 ENCOUNTER — Encounter: Admit: 2022-10-28 | Discharge: 2022-10-28 | Payer: BC Managed Care – PPO

## 2022-10-28 NOTE — Telephone Encounter
Discussed stress test instructions with patient including date, time, and location. Instructions included the following:    NO DECAF OR CAFFEINE 24 HOURS PRIOR TO TEST. Examples: coffee, tea, decaf coffee or tea, cola, chocolate.     DO NOT EAT OR DRINK THE MORNING OF YOUR TEST unless otherwise instructed. (You may have a couple sips of water.) If you are a diabetic, if insulin dependent: please take one third of your insulin with a light breakfast (two pieces of dry toast and a small juice). Bring insulin and medication with you to the test.     TAKE MORNING MEDICATIONS WITH A COUPLE SIPS OF WATER PRIOR TO TEST.     Hold all vitamins and supplements on the morning of your test.     Phone number provided for any additional questions or concerns.

## 2022-10-29 ENCOUNTER — Ambulatory Visit: Admit: 2022-10-29 | Discharge: 2022-10-29 | Payer: BC Managed Care – PPO

## 2022-10-29 ENCOUNTER — Encounter: Admit: 2022-10-29 | Discharge: 2022-10-29 | Payer: BC Managed Care – PPO

## 2022-10-29 DIAGNOSIS — I480 Paroxysmal atrial fibrillation: Secondary | ICD-10-CM

## 2022-10-29 MED ORDER — EUCALYPTUS-MENTHOL MM LOZG
1 | Freq: Once | ORAL | 0 refills | Status: AC | PRN
Start: 2022-10-29 — End: ?

## 2022-10-29 MED ORDER — RP DX TC-99M TETROFOSMIN MCI
11 | Freq: Once | INTRAVENOUS | 0 refills | Status: CP
Start: 2022-10-29 — End: ?

## 2022-10-29 MED ORDER — RP DX TC-99M TETROFOSMIN MCI
29.9 | Freq: Once | INTRAVENOUS | 0 refills | Status: CP
Start: 2022-10-29 — End: ?

## 2022-10-29 MED ORDER — AMINOPHYLLINE 25 MG/ML IV SOLN
50 mg | INTRAVENOUS | 0 refills | Status: AC | PRN
Start: 2022-10-29 — End: ?

## 2022-10-29 MED ORDER — NITROGLYCERIN 0.4 MG SL SUBL
.4 mg | SUBLINGUAL | 0 refills | Status: AC | PRN
Start: 2022-10-29 — End: ?

## 2022-10-29 MED ORDER — REGADENOSON 0.4 MG/5 ML IV SYRG
.4 mg | Freq: Once | INTRAVENOUS | 0 refills | Status: CP
Start: 2022-10-29 — End: ?

## 2022-10-29 MED ORDER — SODIUM CHLORIDE 0.9 % IV SOLP
250 mL | INTRAVENOUS | 0 refills | Status: AC | PRN
Start: 2022-10-29 — End: ?

## 2022-10-29 MED ORDER — ALBUTEROL SULFATE 90 MCG/ACTUATION IN HFAA
2 | RESPIRATORY_TRACT | 0 refills | Status: AC | PRN
Start: 2022-10-29 — End: ?

## 2022-11-26 ENCOUNTER — Encounter: Admit: 2022-11-26 | Discharge: 2022-11-26 | Payer: BC Managed Care – PPO

## 2022-11-26 DIAGNOSIS — Z9229 Personal history of other drug therapy: Secondary | ICD-10-CM

## 2022-11-26 DIAGNOSIS — I4819 Other persistent atrial fibrillation: Secondary | ICD-10-CM

## 2022-11-26 DIAGNOSIS — I4891 Unspecified atrial fibrillation: Secondary | ICD-10-CM

## 2022-11-26 DIAGNOSIS — R0989 Other specified symptoms and signs involving the circulatory and respiratory systems: Secondary | ICD-10-CM

## 2022-11-26 DIAGNOSIS — I10 Essential (primary) hypertension: Secondary | ICD-10-CM

## 2022-11-26 DIAGNOSIS — E119 Type 2 diabetes mellitus without complications: Secondary | ICD-10-CM

## 2022-11-26 DIAGNOSIS — Z87442 Personal history of urinary calculi: Secondary | ICD-10-CM

## 2022-11-26 DIAGNOSIS — G4733 Obstructive sleep apnea (adult) (pediatric): Secondary | ICD-10-CM

## 2022-11-26 DIAGNOSIS — E669 Obesity, unspecified: Secondary | ICD-10-CM

## 2022-11-26 DIAGNOSIS — I517 Cardiomegaly: Secondary | ICD-10-CM

## 2022-11-26 DIAGNOSIS — H269 Unspecified cataract: Secondary | ICD-10-CM

## 2022-11-26 DIAGNOSIS — Z9889 Other specified postprocedural states: Secondary | ICD-10-CM

## 2022-11-26 NOTE — Telephone Encounter
Patient was seen in clinic today. Jeremy Bean discussed previous DCCV on 3/13 with patient. Patient said that DCCV was cancelled due to him being in sinus rhythm. Dr. Maisie Fus note said that patient was cardioverted from AF to SR.     I called the procedure nurses this afternoon and asked if they could tell for sure if the procedure happened. There was NOT an anesthesia note or procedure nurse note so they said it most likely did NOT happen. Dr. Maisie Fus addended his note to say that DCCV was cancelled.     I called patient to apologize about this situation. He was appreciative and denies questions or concerns.

## 2022-11-26 NOTE — Progress Notes
Date of Service: 11/26/2022    Jeremy Bean. is a 57 y.o. male.       HPI     Emani Morad was seen in the office today in electrophysiology follow-up. As you may know, he is a 57 y.o. male, with past medical history including hypertension, obesity, obstructive sleep apnea, and paroxysmal atrial fibrillation status post ablation (10/2007) and residual typical flutter status post ablation (06/21/2009).  He remained on Flecainide for a short time but this was ultimately discontinued.  He did have recurrence of AF with RVR in 2014 and was restarted on Flecainide by Dr. Wallene Huh.      He met with Dr. Wallene Huh in November 2022.  He was next/most recently seen by Dr. Wallene Huh in February 2024.  He was in AF at that time.  Cardioversion and stress test (given ongoing Flecainide use) were planned.  Flecainide was increased to 150mg  twice daily.  Anticoagulation with Eliquis was continued.    10/14/2022 he underwent successful cardioversion to sinus rhythm with 1 synchronized 200 J shock.    On 10/29/2022 he underwent a regadenoson MPI which was normal with no evidence of significant myocardial ischemia.  LVEF was 54%.  There is no significant change from prior stress test in 2021, although the EF at that time was 66%.      Today in clinic he is in atrial fibrillation with a ventricular rate of 86 bpm.  He adamantly denies that a cardioversion was performed on 3/14 although current documentation suggests it was.  I did contact the cardioversion department and they are working to confirm the procedure or amend the documentation.    Either way, there is an ECG from 3/14 at 9:50 AM that shows sinus rhythm 76 bpm.  Assuming no cardioversion was performed then at some point after meeting with Dr. Wallene Huh he spontaneously converted.  However, he presented for his stress test on 3/29 and was noted to be in atrial fibrillation at that time which Mylan confirms.  And again today he remains in atrial fibrillation.  He seems to be unaware of his current AF episodes although he says historically he was aware/symptomatic.  He reports compliance with all of his medications.  He is anticoagulated with Eliquis 5 mg twice daily.  He is on Flecainide 150 mg twice daily and Metoprolol Tartrate 100 mg twice daily.    The last echocardiogram I see is from 2019 and shows an LVEF of 50%.         Vitals:    11/26/22 1354   BP: 136/66   BP Source: Arm, Left Upper   Pulse: 86   SpO2: 98%   O2 Device: None (Room air)   PainSc: Zero   Weight: (!) 142.5 kg (314 lb 3.2 oz)   Height: 185.4 cm (6' 1)     Body mass index is 41.45 kg/m?Marland Kitchen     Past Medical History  Patient Active Problem List    Diagnosis Date Noted    Atrial fibrillation (HCC) 07/26/2018    Abdominal pain 07/26/2018    OSA on CPAP 12/31/2013    Obstructive sleep apnea 06/05/2009     Obstructive sleep apnea status post CPAP initiation approximately three weeks ago      History of rotator cuff surgery 06/05/2009     Rotator cuff repair three years ago      S/P appendectomy 06/05/2009    History of hernia repair 06/05/2009    Coronary artery disease (CAD)  excluded 06/05/2009     0/20/08 cardiac catheterization: Normal coronary arteries and LV function. Echo  10/08 left atrial diameter 4.5 cm. EF 63%, confirmed on TEE in 03/09.  12/06/2012 - Stress Test:  No chest pain with exercise stress.  Poor exercise tolerance for age.  Submaximal ECG exercise stress test per Bruce protocol which was negative for ischemia at the workload achieved.  Occasional atrial and ventricular ectopy; No dysrhythmias noted.  Exercise echocardiogram which was negative for ischemia at workload achieved (However, the sensitivity of test is markedly diminished secondary to low workload and low double product)  12/28/2017 - Regadenoson MPI:  This study is mildly abnormal qualitatively.  There is subtle ischemia perhaps in a diagonal and in the right coronary artery territory.  The lung uptake is in the normal range but the TID ratio is borderline.  From a perfusion standpoint defects are not high risk and quantitative polar map assessment of the defects are not statistically significant.  11/29/2019 - Regadenoson MPI:  This myocardial pressure imaging study is very mildly abnormal but overall not high risk.  There is very small size and mild intensity inferior wall perfusion abnormality that could represent a small ischemic territory towards the LV apex in the RCA distribution.  There are no large areas of jeopardized ischemic myocardium.  Left ventricular cavity was not dilated at rest and did not further delay to stress.  Normal left ventricle systolic function, ejection fraction 66%, there are no regional wall motion abnormalities.  Normal pulmonary to myocardial count ratio and normal left ventricular end-diastolic volume.  Overall, high risk scintigraphic indicators are not present.           Enlarged LA (left atrium) 06/05/2009     Most recent echo today on Dec 04, 2008 shows left atrium actually 3.8 cm and left       ventricular dimension 4.6 cm.      AF (atrial fibrillation) (HCC) 10/24/2007      Paroxysmal atrial fibrillation x 3 years. Became persistent and highly symptomatic status post left atrial ablation for atrial fibrillation 10/24/07 with no re-inducibility in spite of Isuprel and burst pacing up to 200 msec. Patient has isolated arrhythmias coming from the coronary sinus from the deep coronary sinus brought out by high-dose Isuprel.    07/27/2018 - ECHO:  Low normal left ventricle systolic function with estimated LVEF of 50%.  The right ventricle is not visualized well. Limited views suggest normal right ventricular size and contractility.  Mild biatrial dilatation.  Aortic sclerosis without stenosis.  No other significant valvular abnormalities.  No pericardial effusion.  07/31/2018 - TEE / DCCV:  Successful DC cardioversion of Atrial fibrillation to sinus rhythm.   08/03/2018 - DCCV:   Successful DC cardioversion of Atrial fibrillation to sinus rhythm.       Hypertension 10/24/2007     Hypertension x 2 years           Review of Systems   Constitutional: Negative.   HENT: Negative.     Eyes: Negative.    Cardiovascular:  Positive for leg swelling.   Respiratory: Negative.     Endocrine: Negative.    Hematologic/Lymphatic: Negative.    Skin: Negative.    Musculoskeletal:  Positive for back pain and joint pain.   Gastrointestinal: Negative.    Genitourinary: Negative.    Neurological: Negative.    Psychiatric/Behavioral: Negative.     Allergic/Immunologic: Negative.        Physical Exam   Vitals reviewed.  HENT:   Head: Normocephalic and atraumatic.   Eyes: No scleral icterus.   Cardiovascular: Regular rhythm and normal heart sounds.   Pulmonary/Chest: Effort normal and breath sounds normal.   Musculoskeletal:         General: Normal range of motion.      Cervical back: Normal range of motion and neck supple.   Neurological: He is alert and oriented to person, place, and time.   Skin: Skin is warm and dry.   Psychiatric: Mood, judgment and thought content normal.     Cardiovascular Studies  Preliminary ECG today shows atrial fibrillation with ventricular rate of 86 bpm, QRS 100 ms, QT 390 ms, and QTc 466 ms.    Cardiovascular Health Factors  Vitals BP Readings from Last 3 Encounters:   11/26/22 136/66   09/29/22 120/75   06/24/21 112/72     Wt Readings from Last 3 Encounters:   11/26/22 (!) 142.5 kg (314 lb 3.2 oz)   10/29/22 (!) 141.1 kg (311 lb)   09/29/22 (!) 141.1 kg (311 lb)     BMI Readings from Last 3 Encounters:   11/26/22 41.45 kg/m?   10/29/22 41.03 kg/m?   09/29/22 41.03 kg/m?      Smoking Social History     Tobacco Use   Smoking Status Never   Smokeless Tobacco Never      Lipid Profile Cholesterol   Date Value Ref Range Status   01/22/2022 163  Final     HDL   Date Value Ref Range Status   01/22/2022 39 (L) >=40 Final     LDL   Date Value Ref Range Status   01/22/2022 103 (H) <100 Final     Triglycerides   Date Value Ref Range Status   01/22/2022 109  Final      Blood Sugar Hemoglobin A1C   Date Value Ref Range Status   07/26/2018 7.9 (H) 4.0 - 6.0 % Final     Comment:     The ADA recommends that most patients with type 1 and type 2 diabetes maintain   an A1c level <7%.       Glucose   Date Value Ref Range Status   06/07/2022 141 (H) 70 - 105 Final   09/26/2020 146 (H)  Final   04/23/2020 167 (H) 70 - 105 Final     Glucose, POC   Date Value Ref Range Status   08/04/2018 180 (H) 70 - 100 MG/DL Final   16/05/9603 540 (H) 70 - 100 MG/DL Final   98/06/9146 829 (H) 70 - 100 MG/DL Final          Problems Addressed Today  Encounter Diagnoses   Name Primary?    Cardiovascular symptoms Yes    Hypertension, unspecified type     Persistent atrial fibrillation (HCC)        Assessment and Plan     Persistent atrial fibrillation  He has a history of both atrial fibrillation and typical atrial flutter and underwent ablations for both back in 2009 and 2010.  He has had recurrent AF and has been on flecainide for many years.  When he saw Dr. Wallene Huh in February after previously being seen in November 2022 he was noted to be in AF.  Flecainide was increased from 100 to 150 mg twice daily.  Cardioversion was ordered and Quavion tells me today that he presented for that procedure but was found to be in sinus rhythm and the cardioversion was canceled.  (  As noted above, we are working to clarify those details).  If no cardioversion were performed that he did spontaneously convert at some point after meeting with Dr. Wallene Huh in the clinic.  However, on 3/29 he was back in A-fib at the time of his stress test and remains in AF.     I suspect he will need an alternate antiarrhythmic agent.  We did discuss Tikosyn and the requirement for an inpatient admission for initiation.  Of note, he is on hydrochlorothiazide currently.  He says he is agreeable to do what is necessary.  We briefly reviewed the possibility of a repeat ablation after discussion with Dr. Wallene Huh.  We should also repeat an echocardiogram as it has been several years.  I will review with Dr. Wallene Huh first and then update Jaxxson on the plan.    Hypertension  Blood pressure slightly elevated today but has looked good in recent visits.  Continue to monitor.    Sharman Cheek, APRN-C            Current Medications (including today's revisions)   apixaban (ELIQUIS) 5 mg tablet Take one tablet by mouth twice daily.    escitalopram oxalate (LEXAPRO) 10 mg tablet Take one tablet by mouth daily.    FARXIGA 10 mg tablet Take one tablet by mouth daily.    flecainide (TAMBOCOR) 100 mg tablet Take 1.5 tablets by mouth twice daily.    hydroCHLOROthiazide 12.5 mg tablet Take one tablet by mouth daily.    lisinopriL (ZESTRIL) 10 mg tablet Take 1 tablet by mouth once daily    metFORMIN (GLUCOPHAGE) 1,000 mg tablet Take one tablet by mouth twice daily with meals.    metoprolol tartrate (LOPRESSOR) 100 mg tablet Take one tablet by mouth twice daily.    pioglitazone (ACTOS) 30 mg tablet Take one tablet by mouth daily.    tirzepatide (MOUNJARO) 10 mg/0.5 mL injector PEN Inject 0.5 mL under the skin every 7 days.    traMADoL (ULTRAM) 50 mg tablet Take one tablet by mouth as Needed.

## 2022-11-28 ENCOUNTER — Encounter: Admit: 2022-11-28 | Discharge: 2022-11-28 | Payer: BC Managed Care – PPO

## 2022-11-28 MED ORDER — METOPROLOL TARTRATE 100 MG PO TAB
100 mg | ORAL_TABLET | Freq: Two times a day (BID) | ORAL | 0 refills | 90.00000 days | Status: AC
Start: 2022-11-28 — End: ?
  Filled 2022-12-23: qty 90, 90d supply, fill #1

## 2022-12-02 ENCOUNTER — Encounter: Admit: 2022-12-02 | Discharge: 2022-12-02 | Payer: BC Managed Care – PPO

## 2022-12-02 DIAGNOSIS — I4819 Other persistent atrial fibrillation: Secondary | ICD-10-CM

## 2022-12-02 NOTE — Patient Instructions
Tikosyn Admit Pre-Procedure Instructions    Patient Name: Jeremy Bean  MRN#: [id number]  Date of Birth: [birth year] (56 y.o.)  Today's Date: 12/02/2022        ARRIVAL TIME    The [facility] is located at [address], [place], [place] [address]. Park in [address] and enter through the main hospital entrance. Take an immediate right and check in at the Heart Center admitting desk (first desk on your left, just before the coffee shop).     You will be checking in at 10:30am, unless otherwise notified by the nurse team on the cardiovascular unit.    Of note, while this is the required arrival time, a hospital bed might not be available right away. Beds are not reserved for planned admissions. It is possible you might have to wait for a bed.    PROCEDURE  You're scheduled for Tikosyn admission on 5/21. This is a 3 day, 2 night stay. If after 3 doses your heart does not convert to normal rhythm, you will get a cardioversion.      DIET AND FLUID INTAKE    NO CAFFEINE FOR 24 HOURS PRIOR TO SCHEDULED PROCEDURE (I.E., COFFEE, CHOCOLATE, SODA)    If you are scheduled for a Transesophageal ECHO prior to admission, nothing to eat or drink after midnight the night before.       SPECIAL MEDICATIONS INSTRUCTIONS:    (SENT MSG TO [facility] TO CLARIFY WHEN TO STOP FLECAINIDE AND HCTZ. THESE WILL NEED TO BE HELD SOMETIME PRIOR TO ADMISSION. WILL NEED TO ADDEND THIS WHEN WE HEAR BACK. )     HOLD FLECAINIDE    HOLD HCTZ (hydrochlorothiazide)     DO NOT MISS ANY DOSES OF apixaban (Eliquis)        Additional Instructions    Bring photo ID and your health insurance card(s).    Wear comfortable clothes and don't bring valuables, other than photo identification card, with you to the hospital.    BRING A LIST OF YOUR CURRENT MEDICATONS WITH YOU to the hospital.    Please pack a bag for a three day / two night stay. You will need to be in the hospital for a total of five doses.    Please note, you will not be able to take a shower during this stay.  Monitoring for Tikosyn administration requires constant cardiac monitoring.    You will take Tikosyn twice a day. The dosing will be determined during your hospital stay.        ALLERGIES  Allergies   Allergen Reactions    Glipizide MUSCLE PAIN       CURRENT MEDICATIONS  Outpatient Encounter Medications as of 12/02/2022   Medication Sig Dispense Refill    apixaban (ELIQUIS) 5 mg tablet Take one tablet by mouth twice daily. 60 tablet 1    escitalopram oxalate (LEXAPRO) 10 mg tablet Take one tablet by mouth daily.      FARXIGA 10 mg tablet Take one tablet by mouth daily.      flecainide (TAMBOCOR) 100 mg tablet Take 1.5 tablets by mouth twice daily. 60 tablet 1    hydroCHLOROthiazide 12.5 mg tablet Take one tablet by mouth daily.      lisinopriL (ZESTRIL) 10 mg tablet Take 1 tablet by mouth once daily 90 tablet 1    metFORMIN (GLUCOPHAGE) 1,000 mg tablet Take one tablet by mouth twice daily with meals.      metoprolol tartrate (LOPRESSOR) 100  mg tablet Take 1 tablet by mouth twice daily 180 tablet 0    pioglitazone (ACTOS) 30 mg tablet Take one tablet by mouth daily.      tirzepatide (MOUNJARO) 10 mg/0.5 mL injector PEN Inject 0.5 mL under the skin every 7 days.      traMADoL (ULTRAM) 50 mg tablet Take one tablet by mouth as Needed.       No facility-administered encounter medications on file as of 12/02/2022.     _________________________________________  Form completed by: Romilda Joy, RN  Date completed: 12/02/22  Method: Via telephone and mailed to the patient.

## 2022-12-02 NOTE — Telephone Encounter
-----   Message from [name], APRN-NP sent at 12/01/2022  4:52 PM CDT -----  Hi, can we contact him and see if he's interested in admission for Tikosyn?  We briefly talked about this but should generally review process and stress med compliance.     If agreeable can we make arrangements?  He is currently on HCTZ so that would have to be discontinued prior.      He's currently on Flec 150mg  BID with recurrent, persistent AF.  Eliquis.      Thanks         ----- Message -----  From: Jen Mow, MD  Sent: 11/30/2022   9:24 PM CDT  To: [name], APRN-NP    He has some issues with compliance.  He is a long-term patient of mine.  We can consider redo ablation with PFA if he is willing to lose some weight.  He has gained too much weight.  In the interim, dofetilide is an option but he has to be compliant.  Obviously HCTZ need to be stopped if he decides to go with that.  ----- Message -----  From: [name], APRN-NP  Sent: 11/26/2022   3:18 PM CDT  To: Jen Mow, MD    Hello.  I saw this gentleman in follow-up today in [place]. Jeremy Bean.     You saw him in February and he was in AF.  Flec increased to 150 mg.  Cardioversion planned and when he presented 3/14 he adamantly tells me today he was in sinus rhythm and they did not perform the cardioversion, which would mean spontaneous conversion at some point.  (Currently, however, there is a procedure note that says it was done and I did call the cardioversion department and they believe that it in fact was not performed.  They are working to clarify and then amend documentation).      He presented for stress test 3/29 and was in A-fib and he is in A-fib today.  He does not seem to be aware of it.  I talked to him about needing a different medication, perhaps Tikosyn.  He brought up the topic of a possible repeat ablation procedure to which I said I would discuss with you.  He has not had an echo for several years so I was going to update that.  We also talked about just deeming him permanent and focusing on rate control (but he is only 57).  He says he is agreeable to do what ever is necessary.  He is on HCTZ currently.    Let me know your thoughts.  Thanks.

## 2022-12-02 NOTE — Progress Notes
[facility] INPATIENT ANTIARRHYTHMIC START RESERVATION FORM    Patient Name: Jeremy Bean  DOB: March 02, 1966  MRN: [id number]    Name of CVM Percert Team member that confirmed reservation date: Duane  Date reservation confirmed: 5/21    Physician ordering admission: Sharman Cheek, APRN  Nurse scheduling admission: Romilda Joy, RN  Reason for Admission: AF. On Flecainide and in AF.   Admission Date: 5/21  Diagnosis: AF    Additional Orders/Needs:  **The following orders will still need to be placed. Documenting them here does not replaced the signed order.**    ECG: Yes- on HC5.     Labs:  All patients on Warfarin, obtain on day of admit STAT in CVM clinic  POC INR and Serum INR  Tikosyn/Sotalol admissions, obtain on day of admit STAT in CVM clinic  BMP  CBC  Magnesium  Other labs needed: N/A    Anticoagulation:  Yes   Anticoagulation Medication: Eliquis  Therapeutically anticoagulated x4 weeks? Yes  Transesophageal Echocardiogram (TEE) needed? No  If yes, complete the TEE/DCCV Reservation SmartPhrase in a separate note in this encounter ([id number])    Other Medication Considerations:  Is the patient taking:  Chlorthalidone: No  Hydrochlorothiazide: Yes  Verapamil: No    Sent msg to [place] to clarify when patient needs to stop HCTZ and Flecainide. Will put in patient instructions once he replies.       After note is complete, route encounter to CVM Admit Team pool

## 2022-12-02 NOTE — Telephone Encounter
12/02/2022 12:46 PM:   Called and spoke to patient. He is agreeable to Tikosyn admission. He does not have any dates coming up that he is out of town, until July.     I responded to [place] to clarify flecainide and HCTZ:    Patient is agreeable. I'm talking to Duane to figure out a date. Do you want him to stop Flecainide now since he is in AF? Or closer to admission like a few days before? When does he need to stop HCTZ? Doesn't look like our sheet says..    Thank you!  Madelyn Tlatelpa,RN    .Marland KitchenWill wait for response. It looks like he needs to have an echo during admission as well. Will mark that on the pre cert documentation. Sent Duane a msg to ask for next available date. Liborio Nixon is offline. Will also send Cordelia Pen a msg to price check. Will get back to patient once Duane responds with dates.

## 2022-12-02 NOTE — Progress Notes
Cardioversion (DCCV) Precert Form    Procedure Type: Cardioversion (DCCV) Only                  Patient Name: Jeremy Bean  Date of Birth: 1965/08/09  MRN: [id number]  Physician ordering: Sharman Cheek, APRN  Nurse: Romilda Joy, RN  Part of Admission?: Yes  Procedure Date: 5/22 1430   Diagnosis: Atrial Fibrillation  Reminders:   Order the DCCV using order set in O2 so it can be scheduled correctly  When ordering the DCCV using order set in O2 for Medication Admits only - change class within order set to Hospital performed so it is scheduled correctly       After note is complete, route encounter to CVM Admit Team pool

## 2022-12-06 ENCOUNTER — Encounter: Admit: 2022-12-06 | Discharge: 2022-12-06 | Payer: BC Managed Care – PPO

## 2022-12-06 NOTE — Progress Notes
website with [employer] [place], [email or web address], confirmed benefits and eligibility:  Current and active since 08/02/2022, $1500 deductible with required co-insurance of 20% to max OOP $6350, then plan will pay 100% of allowable charges.  Pre-certification through Prior [employer], [phone], is required for Inpatient Tikosyn Admission.    Carollee Herter, clinical review nurse for [employer] [place] prior auth department, after discussion and review, gave approval for 2 inpatient days starting on 12/21/2022.  Hospital to notifiy on arrival and concurrent review or discharge anticipated on 12/23/2022.  Authorization #[id number]

## 2022-12-13 ENCOUNTER — Encounter: Admit: 2022-12-13 | Discharge: 2022-12-13 | Payer: BC Managed Care – PPO

## 2022-12-14 ENCOUNTER — Encounter: Admit: 2022-12-14 | Discharge: 2022-12-14 | Payer: BC Managed Care – PPO

## 2022-12-14 NOTE — Progress Notes
Report Sheet    DCCV           Indication: Afib/TIKOSYN ADMIT  Ordering Provider: Sharman Cheek    Name: [name]     Age: 57 y.o.    DOB: [birth year]     MRN: [id number]    RM #: [id number]     RN:     Phone #: [phone]    Isolation: NONE    Communication Barrier: None noted    NPO    A&O    Travel    IV    O2    Additional Information: Patient is on Mounjaro    Lab(s) needed: See inpt lab values, look at Glucose,  EKG  Other:     Device check needed: No  Device: No results found for: GENERATOR, EPDEVTYP    Prior TEE: 07/31/18     Prior DCCV: 08/03/18     Prior Echo:07/27/18   Previous TEE/DCCV details: DCCV:  One shock, 200 J, converted SR    EF:   ECHO EF   Date Value Ref Range Status   07/31/2018 60 % Final       Anticoag:Eliquis     Dose:5mg      Frequency:BID     Last Dose:     Missed:    LAB:   Lab Results   Component Value Date/Time    INR 1.1 08/03/2018 05:27 AM    HGB 14.8 06/07/2022 12:00 AM    PLTCT 256 06/07/2022 12:00 AM    GLU 141 (H) 06/07/2022 12:00 AM    NA 137 06/07/2022 12:00 AM    K 3.6 06/07/2022 12:00 AM    CR 1.10 06/07/2022 12:00 AM   /    Probe   Gastric Surgery    GERD    Swallow difficulty    Head/Neck Surg/Rad    Chest Surg/Rad    EGD    Varices/Esophag CA    GI bleed    Dental issues      Sedation   COPD    OSA/CPAP YES   Asthma    Pulm HTN    Anesthesia Issues    Smoker NEVER   ETOH & Frequency    Drug Use    Chronic Pain Med    GLP-1 Last dose:     Current Medications:    apixaban (ELIQUIS) 5 mg tablet Take one tablet by mouth twice daily.    escitalopram oxalate (LEXAPRO) 10 mg tablet Take one tablet by mouth daily.    FARXIGA 10 mg tablet Take one tablet by mouth daily.    hydroCHLOROthiazide 12.5 mg tablet Take one tablet by mouth daily.    lisinopriL (ZESTRIL) 10 mg tablet Take 1 tablet by mouth once daily    metFORMIN (GLUCOPHAGE) 1,000 mg tablet Take one tablet by mouth twice daily with meals.    metoprolol tartrate (LOPRESSOR) 100 mg tablet Take 1 tablet by mouth twice daily pioglitazone (ACTOS) 30 mg tablet Take one tablet by mouth daily.    tirzepatide (MOUNJARO) 10 mg/0.5 mL injector PEN Inject 0.5 mL under the skin every 7 days.    traMADoL (ULTRAM) 50 mg tablet Take one tablet by mouth as Needed.       Past Medical/Surgical History:   Patient Active Problem List    Diagnosis Date Noted    Atrial fibrillation (HCC) 07/26/2018    Abdominal pain 07/26/2018    OSA on CPAP 12/31/2013    Obstructive sleep apnea 06/05/2009  History of rotator cuff surgery 06/05/2009    S/P appendectomy 06/05/2009    History of hernia repair 06/05/2009    Coronary artery disease (CAD) excluded 06/05/2009    Enlarged LA (left atrium) 06/05/2009    AF (atrial fibrillation) (HCC) 10/24/2007    Hypertension 10/24/2007      Past Medical History:   Diagnosis Date    Atrial fibrillation (HCC) 2006    Cataract     Diabetes (HCC)     Enlarged LA (left atrium) 06/05/2009    History of hernia repair 06/05/2009    History of renal calculi 2009    Bilateral    History of rotator cuff surgery 06/05/2009    HTN (hypertension)     HX: anticoagulation     Obesity     Obstructive sleep apnea 06/05/2009      Surgical History:   Procedure Laterality Date    HX HEART CATHETERIZATION  2008    CARDIOVASCULAR STRESS TEST  11/2017    This study is mildly abnormal qualitatively.  There is subtle ischemia perhaps in a diagonal and in the right coronary artery territory.    ESOPHAGOSCOPY WITH ENDOSCOPIC ULTRASOUND EXAMINATION - FLEXIBLE N/A 07/28/2018    Performed by Vertell Novak, MD at [facility] ENDO    DOPPLER ECHOCARDIOGRAPHY      ELECTROCARDIOGRAM      EVENT MONITOR      HX APPENDECTOMY      TRANSESOPHAGEAL ECHO         Allergies:   Allergies   Allergen Reactions    Glipizide MUSCLE PAIN     Sharyon Medicus, RN

## 2022-12-14 NOTE — Telephone Encounter
Contacted patient today to instruct on withholding current prescription Mounjaro until after hospitalization, in the case a DCCV may have to be done on Wednesday, 12/22/22.  Patient states that he takes his Mounjaro on Wednesdays, last dose 12/08/22.  Patient understands to hold medication.

## 2022-12-15 ENCOUNTER — Encounter: Admit: 2022-12-15 | Discharge: 2022-12-15 | Payer: BC Managed Care – PPO

## 2022-12-15 NOTE — Telephone Encounter
-----   Message from [facility] sent at 12/15/2022  2:57 PM CDT -----  Regarding: RAD- date clarify  VM on triage line from patient at 2:40pm.  Papers that he got from [place] say his procedure is 12-21-22 at 10:30am and reminder said 12-22-22 at 2:30pm.  Which is it?  Call me please at #[phone].

## 2022-12-15 NOTE — Telephone Encounter
Spoke to patient regarding Tikosyn admission on 5/21 and DCCV on 5/22, clarified when to show up and what the other date is for. Reviewed plan with the patient. Patient verbalized understanding and does not have any further questions or concerns. No further education requested from patient. Patient has our contact information for future needs.

## 2022-12-17 ENCOUNTER — Inpatient Hospital Stay: Payer: BC Managed Care – PPO

## 2022-12-21 ENCOUNTER — Encounter: Admit: 2022-12-21 | Discharge: 2022-12-21 | Payer: BC Managed Care – PPO

## 2022-12-21 ENCOUNTER — Inpatient Hospital Stay: Admit: 2022-12-21 | Discharge: 2022-12-21 | Payer: BC Managed Care – PPO

## 2022-12-21 MED ADMIN — SODIUM CHLORIDE 0.9 % IV SOLP [27838]: 250 mL | INTRAVENOUS | @ 21:00:00 | Stop: 2022-12-21 | NDC 00338004902

## 2022-12-21 MED ADMIN — MAGNESIUM SULFATE IN D5W 1 GRAM/100 ML IV PGBK [166578]: 1 g | INTRAVENOUS | @ 21:00:00 | Stop: 2022-12-21 | NDC 00264440054

## 2022-12-21 MED ADMIN — DOFETILIDE 500 MCG PO CAP [82575]: 500 ug | ORAL | @ 21:00:00 | NDC 69452013317

## 2022-12-21 MED ADMIN — METFORMIN 500 MG PO TAB [10544]: 1000 mg | ORAL | @ 23:00:00 | NDC 60687015511

## 2022-12-22 ENCOUNTER — Encounter: Admit: 2022-12-22 | Discharge: 2022-12-22 | Payer: BC Managed Care – PPO

## 2022-12-22 ENCOUNTER — Ambulatory Visit: Admit: 2022-12-22 | Discharge: 2022-12-22 | Payer: BC Managed Care – PPO

## 2022-12-22 DIAGNOSIS — G4733 Obstructive sleep apnea (adult) (pediatric): Secondary | ICD-10-CM

## 2022-12-22 DIAGNOSIS — Z9889 Other specified postprocedural states: Secondary | ICD-10-CM

## 2022-12-22 DIAGNOSIS — I10 Essential (primary) hypertension: Secondary | ICD-10-CM

## 2022-12-22 DIAGNOSIS — Z87442 Personal history of urinary calculi: Secondary | ICD-10-CM

## 2022-12-22 DIAGNOSIS — E119 Type 2 diabetes mellitus without complications: Secondary | ICD-10-CM

## 2022-12-22 DIAGNOSIS — I4891 Unspecified atrial fibrillation: Secondary | ICD-10-CM

## 2022-12-22 DIAGNOSIS — I517 Cardiomegaly: Secondary | ICD-10-CM

## 2022-12-22 DIAGNOSIS — Z9229 Personal history of other drug therapy: Secondary | ICD-10-CM

## 2022-12-22 DIAGNOSIS — E669 Obesity, unspecified: Secondary | ICD-10-CM

## 2022-12-22 DIAGNOSIS — H269 Unspecified cataract: Secondary | ICD-10-CM

## 2022-12-22 MED ADMIN — DOFETILIDE 500 MCG PO CAP [82575]: 500 ug | ORAL | @ 07:00:00 | NDC 72205004160

## 2022-12-22 MED ADMIN — APIXABAN 5 MG PO TAB [315778]: 5 mg | ORAL | @ 01:00:00 | NDC 00003089431

## 2022-12-22 MED ADMIN — PIOGLITAZONE 30 MG PO TAB [82256]: 30 mg | ORAL | @ 14:00:00 | NDC 57237022090

## 2022-12-22 MED ADMIN — APIXABAN 5 MG PO TAB [315778]: 5 mg | ORAL | @ 14:00:00 | NDC 00003089431

## 2022-12-22 MED ADMIN — LACTATED RINGERS IV SOLP [4318]: 500 mL | INTRAVENOUS | @ 14:00:00 | Stop: 2022-12-22 | NDC 00338011704

## 2022-12-22 MED ADMIN — ESCITALOPRAM OXALATE 10 MG PO TAB [86689]: 15 mg | ORAL | @ 14:00:00 | NDC 00904642661

## 2022-12-22 MED ADMIN — LISINOPRIL 10 MG PO TAB [10449]: 10 mg | ORAL | @ 14:00:00 | NDC 00904679861

## 2022-12-22 MED ADMIN — METFORMIN 500 MG PO TAB [10544]: 1000 mg | ORAL | @ 22:00:00 | NDC 60687015511

## 2022-12-22 MED ADMIN — MAGNESIUM SULFATE IN D5W 1 GRAM/100 ML IV PGBK [166578]: 1 g | INTRAVENOUS | @ 18:00:00 | Stop: 2022-12-22 | NDC 00264440054

## 2022-12-22 MED ADMIN — DOFETILIDE 500 MCG PO CAP [82575]: 500 ug | ORAL | @ 17:00:00 | NDC 72205004160

## 2022-12-22 MED ADMIN — METOPROLOL TARTRATE 25 MG PO TAB [37637]: 100 mg | ORAL | @ 01:00:00 | NDC 51079025501

## 2022-12-22 MED ADMIN — MAGNESIUM OXIDE 400 MG (241.3 MG MAGNESIUM) PO TAB [10491]: 400 mg | ORAL | @ 22:00:00 | NDC 10006070028

## 2022-12-22 MED ADMIN — POTASSIUM CHLORIDE 20 MEQ PO TBTQ [35943]: 40 meq | ORAL | @ 14:00:00 | Stop: 2022-12-22 | NDC 00832532510

## 2022-12-22 MED ADMIN — MAGNESIUM SULFATE IN D5W 1 GRAM/100 ML IV PGBK [166578]: 1 g | INTRAVENOUS | @ 14:00:00 | Stop: 2022-12-22 | NDC 00264440054

## 2022-12-22 MED ADMIN — DAPAGLIFLOZIN PROPANEDIOL 10 MG PO TAB [320172]: 10 mg | ORAL | @ 14:00:00 | NDC 00310621039

## 2022-12-22 NOTE — Anesthesia Pre-Procedure Evaluation
Anesthesia Pre-Procedure Evaluation    Name: Jeremy Bean      MRN: [id number]     DOB: Nov 01, 1965     Age: 57 y.o.     Sex: male   _________________________________________________________________________     Procedure Info:   Procedure Information       Date/Time: 12/22/22 1330    Scheduled providers: Carolanne Grumbling, MD; Charissa Bash, RN    Procedure: CARDIOVERSION, EXTERNAL    Location: Cardiovascular Medicine: [facility]            Physical Assessment  Vital Signs (last filed in past 24 hours):  BP: 136/98 (05/22 1200)  Temp: 36.6 ?C (97.9 ?F) (05/22 1200)  Pulse: 87 (05/22 0400)  Respirations: 18 PER MINUTE (05/22 1200)  SpO2: 99 % (05/22 1200)  O2 Device: None (Room air) (05/22 1200)      Patient History   Allergies   Allergen Reactions    Glipizide MUSCLE PAIN        Current Medications    Medication Directions   apixaban (ELIQUIS) 5 mg tablet Take one tablet by mouth twice daily.   escitalopram oxalate (LEXAPRO) 10 mg tablet Take 1.5 tablets by mouth daily.   FARXIGA 10 mg tablet Take one tablet by mouth daily.   lisinopriL (ZESTRIL) 10 mg tablet Take 1 tablet by mouth once daily   metFORMIN (GLUCOPHAGE) 1,000 mg tablet Take one tablet by mouth twice daily with meals.   metoprolol tartrate (LOPRESSOR) 100 mg tablet Take 1 tablet by mouth twice daily   pioglitazone (ACTOS) 30 mg tablet Take one tablet by mouth daily.   tirzepatide (MOUNJARO) 10 mg/0.5 mL injector PEN Inject 0.5 mL under the skin every 7 days.       Review of Systems/Medical History      Patient summary reviewed  Nursing notes reviewed  Pertinent labs reviewed    PONV Screening: Non-smoker    No history of anesthetic complications    No family history of anesthetic complications      Airway - negative        Pulmonary       Not a current smoker        No indications/hx of asthma      no COPD         Obstructive Sleep Apnea          Interventions: CPAP; compliant      Cardiovascular       Recent diagnostic studies: ECG and echocardiogram      Exercise tolerance: >4 METS       Beta Blocker therapy: Yes      Beta blockers within 24 hours: Yes      Hypertension, poorly controlled          Coronary artery disease (multiple RFs)      No coronary artery bypass graft        No PTCA            Dysrhythmias (s/p ablationx2); atrial fibrillation        GI/Hepatic/Renal             No GERD          No nausea      No vomiting        Neuro/Psych - negative      No seizures        No CVA      No chronic opioid use      No  chronic benzodiazepine use      Musculoskeletal         Hx elbow osteomyelitis; s/p bilateral TKA        Endocrine/Other       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    Abdominal Findings:       Obese    Neurological Findings:       Alert and oriented x 3    Constitutional findings:       No acute distress      Well-developed       Previous Airway Procedure Notes Displaying the 3 most recent records   No records found.         Patient Lines/Drains/Airways Status       Active Lines:       Name Placement date Placement time Site Days    Peripheral IV 12/21/22 1326 Right Anterior Forearm 20 G 12/21/22  1326  -- 1                  Diagnostic Tests  Hematology:   Lab Results   Component Value Date    HGB 13.5 12/22/2022    HCT 42.0 12/22/2022    PLTCT 225 12/22/2022    WBC 7.7 12/22/2022    NEUT 54 08/04/2018    ANC 4.60 08/04/2018    ALC 2.60 08/04/2018    MONA 12 08/04/2018    AMC 1.00 08/04/2018    EOSA 2 08/04/2018    ABC 0.10 08/04/2018    MCV 89.3 12/22/2022    MCH 28.8 12/22/2022    MCHC 32.2 12/22/2022    MPV 7.7 12/22/2022    RDW 14.2 12/22/2022         General Chemistry:   Lab Results   Component Value Date    NA 139 12/22/2022    K 3.8 12/22/2022    CL 110 12/22/2022    CO2 20 12/22/2022    GAP 9 12/22/2022    BUN 19 12/22/2022    CR 0.79 12/22/2022    GLU 107 12/22/2022    CA 7.5 12/22/2022    ALBUMIN 4.1 12/21/2022    LACTIC 1.0 07/27/2018    OBSCA 1.16 12/22/2022    MG 1.7 12/22/2022    TOTBILI 0.4 12/21/2022    PO4 3.2 12/21/2022      Coagulation:   Lab Results   Component Value Date    INR 1.1 08/03/2018       PAC Plan    Anesthesia Plan    ASA score: 3   Plan: MAC  NPO status: acceptable      Informed Consent  Anesthetic plan and risks discussed with patient.  Use of blood products discussed with patient  Blood Consent: consented      Plan discussed with: anesthesiologist.  Comments: (I discussed the risks/benefits of proceeding with Monitored Anesthesia Care including: 1) possible awareness during the procedure, 2) change of anesthetic plan and conversion to a general anesthetic, and 3) the need to be awakened during key portions of the procedure by the surgeon. The patient expressed understanding and will proceed with Monitored Anesthesia Care. Pt seen and identified in holding, history and physical performed, all questions answered. Risks including sore throat, oral/dental injury, allergic reactions, PONV, aspiration, respiratory failure, MI, CVA discussed with patient who reports understanding and consents to anesthetic plan.  Patient consented for MAC with GA as a backup. Discussed risks of anesthesia with patient, and patient is agreeable.    Pt in NSR and cardioversion canceled for today.)      Alerts: No                        07/2018    Interpretation Summary  Show Result ComparisonTechnically difficult study due to patient's body habitus and poor endocardial visualization.  Definity contrast was used for endocardial border enhancement.     Low normal left ventricle systolic function with estimated LVEF of 50%.    The right ventricle is not visualized well. Limited views suggest normal right ventricular size and contractility.  Mild biatrial dilatation.  Aortic sclerosis without stenosis.  No other significant valvular abnormalities  No pericardial effusion.     Echocardiographic Findings    Left Ventricle Normal size and shape. Mild concentric hypertrophy. Low normal ejection fraction. All left ventricular segments appear to contract at the lower limits of normal.  Overall left ventricular systolic function appears to be at the lower limits of normal. The estimated left ventricular ejection fraction is 50%.  Left ventricular systolic function appears mildly decreased when compared with the prior echocardiogram obtained on 12/06/2012. The monophasic mitral inflow profile and tissue doppler profile make interpretation of diastolic function difficult.   Right Ventricle The right ventricle is not visualized well. Limited views suggest normal right ventricular size and contractility.   Left Atrium Mildly dilated.   Right Atrium Mildly dilated.   IVC/SVC Inferior vena cava was not well seen; right atrial pressure assumed to be 10 mm Hg.   Mitral Valve Normal valve structure. No stenosis. No regurgitation.   Tricuspid Valve Normal valve structure. No stenosis. Trace regurgitation.   Aortic Valve The valve is sclerotic. No stenosis. No regurgitation.   Pulmonary Normal valve structure. No stenosis. Trace regurgitation.   Aorta The aortic root and ascending aorta are normal in size.   Pericardium Pericardium is normal. No pericardial effusion.             Narrative & Impression    IMPRESSION  Atrial fibrillation  Low voltage QRS  Cannot rule out Anterior infarct (cited on or before 22-Dec-2022)  Abnormal ECG  When compared with ECG of 21-Dec-2022 17:57,  No significant change was found

## 2022-12-23 ENCOUNTER — Encounter: Admit: 2022-12-23 | Discharge: 2022-12-23 | Payer: BC Managed Care – PPO

## 2022-12-23 MED ADMIN — DOFETILIDE 500 MCG PO CAP [82575]: 500 ug | ORAL | @ 13:00:00 | Stop: 2022-12-23 | NDC 72205004160

## 2022-12-23 MED ADMIN — LISINOPRIL 10 MG PO TAB [10449]: 10 mg | ORAL | @ 13:00:00 | Stop: 2022-12-23 | NDC 00904679861

## 2022-12-23 MED ADMIN — DAPAGLIFLOZIN PROPANEDIOL 10 MG PO TAB [320172]: 10 mg | ORAL | @ 13:00:00 | Stop: 2022-12-23 | NDC 00310621039

## 2022-12-23 MED ADMIN — METFORMIN 500 MG PO TAB [10544]: 1000 mg | ORAL | @ 13:00:00 | Stop: 2022-12-23 | NDC 70010006301

## 2022-12-23 MED ADMIN — APIXABAN 5 MG PO TAB [315778]: 5 mg | ORAL | @ 03:00:00 | NDC 00003089431

## 2022-12-23 MED ADMIN — MAGNESIUM OXIDE 400 MG (241.3 MG MAGNESIUM) PO TAB [10491]: 400 mg | ORAL | @ 13:00:00 | Stop: 2022-12-23 | NDC 10006070028

## 2022-12-23 MED ADMIN — MAGNESIUM OXIDE 400 MG (241.3 MG MAGNESIUM) PO TAB [10491]: 400 mg | ORAL | @ 03:00:00 | NDC 10006070028

## 2022-12-23 MED ADMIN — METOPROLOL SUCCINATE 100 MG PO TB24 [78380]: 50 mg | ORAL | @ 13:00:00 | Stop: 2022-12-23 | NDC 00904632461

## 2022-12-23 MED ADMIN — PIOGLITAZONE 30 MG PO TAB [82256]: 30 mg | ORAL | @ 13:00:00 | Stop: 2022-12-23 | NDC 57237022090

## 2022-12-23 MED ADMIN — APIXABAN 5 MG PO TAB [315778]: 5 mg | ORAL | @ 13:00:00 | Stop: 2022-12-23 | NDC 00003089431

## 2022-12-23 MED ADMIN — ESCITALOPRAM OXALATE 10 MG PO TAB [86689]: 15 mg | ORAL | @ 13:00:00 | Stop: 2022-12-23 | NDC 00904642661

## 2022-12-23 MED ADMIN — DOFETILIDE 500 MCG PO CAP [82575]: 500 ug | ORAL | @ 03:00:00 | NDC 72205004160

## 2022-12-23 MED FILL — DOFETILIDE 500 MCG PO CAP: 500 mcg | ORAL | 30 days supply | Qty: 60 | Fill #1 | Status: CP

## 2023-01-17 ENCOUNTER — Encounter: Admit: 2023-01-17 | Discharge: 2023-01-17 | Payer: BC Managed Care – PPO

## 2023-02-04 ENCOUNTER — Encounter: Admit: 2023-02-04 | Discharge: 2023-02-04 | Payer: BC Managed Care – PPO

## 2023-02-21 ENCOUNTER — Encounter: Admit: 2023-02-21 | Discharge: 2023-02-21 | Payer: BC Managed Care – PPO

## 2023-02-28 ENCOUNTER — Encounter: Admit: 2023-02-28 | Discharge: 2023-02-28 | Payer: BC Managed Care – PPO

## 2023-02-28 DIAGNOSIS — G4733 Obstructive sleep apnea (adult) (pediatric): Secondary | ICD-10-CM

## 2023-02-28 DIAGNOSIS — Z9229 Personal history of other drug therapy: Secondary | ICD-10-CM

## 2023-02-28 DIAGNOSIS — Z9889 Other specified postprocedural states: Secondary | ICD-10-CM

## 2023-02-28 DIAGNOSIS — E669 Obesity, unspecified: Secondary | ICD-10-CM

## 2023-02-28 DIAGNOSIS — I10 Essential (primary) hypertension: Secondary | ICD-10-CM

## 2023-02-28 DIAGNOSIS — Z87442 Personal history of urinary calculi: Secondary | ICD-10-CM

## 2023-02-28 DIAGNOSIS — E119 Type 2 diabetes mellitus without complications: Secondary | ICD-10-CM

## 2023-02-28 DIAGNOSIS — I4891 Unspecified atrial fibrillation: Secondary | ICD-10-CM

## 2023-02-28 DIAGNOSIS — I4819 Other persistent atrial fibrillation: Secondary | ICD-10-CM

## 2023-02-28 DIAGNOSIS — H269 Unspecified cataract: Secondary | ICD-10-CM

## 2023-02-28 DIAGNOSIS — R0989 Other specified symptoms and signs involving the circulatory and respiratory systems: Secondary | ICD-10-CM

## 2023-02-28 DIAGNOSIS — I517 Cardiomegaly: Secondary | ICD-10-CM

## 2023-02-28 NOTE — Progress Notes
Date of Service: 02/28/2023    Jeremy Bean. is a 57 y.o. male.       HPI       Jeremy Bean was seen in the office today in electrophysiology follow-up. As you may know, he is a 57 y.o. male, with past medical history including hypertension, obesity, obstructive sleep apnea, and paroxysmal atrial fibrillation status post ablation (10/2007) and residual typical flutter status post ablation (06/21/2009). He remained on Flecainide for a short time but this was ultimately discontinued. He did have recurrence of AF with RVR in 2014 and was restarted on Flecainide by Dr. Wallene Huh.     He was seen by Dr. Wallene Huh in February 2024.  He was in AF at that time.  Cardioversion and stress test (given ongoing Flecainide use) were planned.  Flecainide was increased to 150mg  twice daily.  Anticoagulation with Eliquis was continued.  He presented for a cardioversion on 10/14/2022 but was found to be in sinus rhythm.  On 10/29/2022 he underwent a regadenoson MPI which did not show any evidence of significant myocardial ischemia.  I met with [name] in the clinic on 11/26/2022 and he was in atrial fibrillation with controlled ventricular rates.  Ultimately, plans were made for discontinuation of Flecainide and admission for Tikosyn.     He was admitted in late May 2024 and loaded with Tikosyn 500 mcg twice daily.  He presented in AF and converted after his third dose.  He did report feeling less dyspnea on exertion and more energy in sinus rhythm.  Toprol-XL 100 mg daily was continued at discharge.      Today in clinic he tells me that initially he felt better in sinus rhythm but more recently he has been feeling tired.  He works in a [employer] claims handler.  He says his legs feel heavy on the way home from work.  He has not been sleeping well.  He recently got a new CPAP mask which he does wear nightly.  He drinks a lot of water while at work.  They typically do not run their air conditioner at home.  He tells me that a week ago he was at [facility] and they noted his heart rate to be 119 and told him he was in AF.  ECG today shows sinus rhythm with PACs.       Vitals:    02/28/23 1500   BP: 129/84   BP Source: Arm, Left Upper   Pulse: 97   SpO2: 96%   O2 Device: None (Room air)   PainSc: Zero   Weight: (!) 147.7 kg (325 lb 9.6 oz)   Height: 185.4 cm (6' 1)     Body mass index is 42.96 kg/m?Marland Kitchen     Past Medical History  Patient Active Problem List    Diagnosis Date Noted    Encounter for monitoring dofetilide therapy 12/21/2022    Atrial fibrillation (HCC) 07/26/2018    Abdominal pain 07/26/2018    OSA on CPAP 12/31/2013    Obstructive sleep apnea 06/05/2009     Obstructive sleep apnea status post CPAP initiation approximately three weeks ago      History of rotator cuff surgery 06/05/2009     Rotator cuff repair three years ago      S/P appendectomy 06/05/2009    History of hernia repair 06/05/2009    Coronary artery disease (CAD) excluded 06/05/2009     0/20/08 cardiac catheterization: Normal coronary arteries and LV function. Echo  10/08  left atrial diameter 4.5 cm. EF 63%, confirmed on TEE in 03/09.  12/06/2012 - Stress Test:  No chest pain with exercise stress.  Poor exercise tolerance for age.  Submaximal ECG exercise stress test per Bruce protocol which was negative for ischemia at the workload achieved.  Occasional atrial and ventricular ectopy; No dysrhythmias noted.  Exercise echocardiogram which was negative for ischemia at workload achieved (However, the sensitivity of test is markedly diminished secondary to low workload and low double product)  12/28/2017 - Regadenoson MPI:  This study is mildly abnormal qualitatively.  There is subtle ischemia perhaps in a diagonal and in the right coronary artery territory.  The lung uptake is in the normal range but the TID ratio is borderline.  From a perfusion standpoint defects are not high risk and quantitative polar map assessment of the defects are not statistically significant.  11/29/2019 - Regadenoson MPI:  This myocardial pressure imaging study is very mildly abnormal but overall not high risk.  There is very small size and mild intensity inferior wall perfusion abnormality that could represent a small ischemic territory towards the LV apex in the RCA distribution.  There are no large areas of jeopardized ischemic myocardium.  Left ventricular cavity was not dilated at rest and did not further delay to stress.  Normal left ventricle systolic function, ejection fraction 66%, there are no regional wall motion abnormalities.  Normal pulmonary to myocardial count ratio and normal left ventricular end-diastolic volume.  Overall, high risk scintigraphic indicators are not present.           Enlarged LA (left atrium) 06/05/2009     Most recent echo today on Dec 04, 2008 shows left atrium actually 3.8 cm and left       ventricular dimension 4.6 cm.      AF (atrial fibrillation) (HCC) 10/24/2007      Paroxysmal atrial fibrillation x 3 years. Became persistent and highly symptomatic status post left atrial ablation for atrial fibrillation 10/24/07 with no re-inducibility in spite of Isuprel and burst pacing up to 200 msec. Patient has isolated arrhythmias coming from the coronary sinus from the deep coronary sinus brought out by high-dose Isuprel.    07/27/2018 - ECHO:  Low normal left ventricle systolic function with estimated LVEF of 50%.  The right ventricle is not visualized well. Limited views suggest normal right ventricular size and contractility.  Mild biatrial dilatation.  Aortic sclerosis without stenosis.  No other significant valvular abnormalities.  No pericardial effusion.  07/31/2018 - TEE / DCCV:  Successful DC cardioversion of Atrial fibrillation to sinus rhythm.   08/03/2018 - DCCV:   Successful DC cardioversion of Atrial fibrillation to sinus rhythm.       Hypertension 10/24/2007     Hypertension x 2 years           Review of Systems   Constitutional: Positive for malaise/fatigue and weight gain.   HENT: Negative.     Eyes: Negative.    Cardiovascular:  Positive for dyspnea on exertion, irregular heartbeat and palpitations.   Respiratory:  Positive for shortness of breath.    Endocrine: Positive for polyuria.   Hematologic/Lymphatic: Negative.    Skin: Negative.    Musculoskeletal:  Positive for back pain and myalgias.   Gastrointestinal: Negative.    Genitourinary:  Positive for nocturia.   Neurological:  Positive for excessive daytime sleepiness.   Psychiatric/Behavioral: Negative.     Allergic/Immunologic: Negative.        Physical Exam  HENT:   Head: Normocephalic and atraumatic.   Eyes: No scleral icterus.   Cardiovascular: Normal rate, regular rhythm and normal heart sounds.   Pulmonary/Chest: Effort normal and breath sounds normal.   Musculoskeletal:         General: Normal range of motion.      Cervical back: Normal range of motion and neck supple.   Neurological: He is alert and oriented to person, place, and time.   Skin: Skin is warm and dry.   Psychiatric: Mood, judgment and thought content normal.     Cardiovascular Studies  Preliminary ECG today shows sinus rhythm 92 bpm, PR 176 ms, QRS 88 ms, and QTc 433 (personal measurement).    Cardiovascular Health Factors  Vitals BP Readings from Last 3 Encounters:   02/28/23 129/84   12/23/22 105/58   11/26/22 136/66     Wt Readings from Last 3 Encounters:   02/28/23 (!) 147.7 kg (325 lb 9.6 oz)   12/23/22 (!) 138.5 kg (305 lb 6.4 oz)   11/26/22 (!) 142.5 kg (314 lb 3.2 oz)     BMI Readings from Last 3 Encounters:   02/28/23 42.96 kg/m?   12/23/22 40.29 kg/m?   11/26/22 41.45 kg/m?      Smoking Social History     Tobacco Use   Smoking Status Never   Smokeless Tobacco Never      Lipid Profile Cholesterol   Date Value Ref Range Status   01/22/2022 163  Final     HDL   Date Value Ref Range Status   01/22/2022 39 (L) >=40 Final     LDL   Date Value Ref Range Status   01/22/2022 103 (H) <100 Final     Triglycerides   Date Value Ref Range Status 01/22/2022 109  Final      Blood Sugar Hemoglobin A1C   Date Value Ref Range Status   07/26/2018 7.9 (H) 4.0 - 6.0 % Final     Comment:     The ADA recommends that most patients with type 1 and type 2 diabetes maintain   an A1c level <7%.       Glucose   Date Value Ref Range Status   12/23/2022 108 (H) 70 - 100 MG/DL Final   16/05/9603 540 (H) 70 - 100 MG/DL Final   98/06/9146 829 (H) 70 - 100 MG/DL Final     Glucose, POC   Date Value Ref Range Status   12/23/2022 102 (H) 70 - 100 MG/DL Final   56/21/3086 578 (H) 70 - 100 MG/DL Final   46/96/2952 841 (H) 70 - 100 MG/DL Final          Problems Addressed Today  Encounter Diagnoses   Name Primary?    Cardiovascular symptoms Yes    Hypertension, unspecified type     Persistent atrial fibrillation (HCC)     OSA on CPAP        Assessment and Plan     Persistent atrial fibrillation  He was having persistent AF earlier in the year.  He was admitted in May and initiated on Tikosyn 500 mcg twice daily.  He spontaneously converted after the third dose.  He is maintaining sinus rhythm per ECG today.  He may be having some recurrent brief episodes of AF as he was told he was likely in AF with a heart rate of 119 bpm at a recent [facility] visit.  He will continue on the Tikosyn, Eliquis 5 mg twice daily, and  Toprol-XL 100 mg daily.  We have him follow-up with Dr. Wallene Huh in 6 months and mail him a 7-day ZIO in about 4 months to look for any recurrent AF.    Hypertension  Blood pressure well-controlled today.  I have not made any changes to his medications.    OSA  He reports compliance with his CPAP which he has worn for many years.  He recently did just get a new CPAP machine.  He is having symptoms suggestive of OSA including poor sleep, daytime tiredness, as well as heaviness in his legs.  He was encouraged to reach out to his team to see if his settings need adjusting.    Sharman Cheek, APRN-C            Current Medications (including today's revisions)   apixaban (ELIQUIS) 5 mg tablet Take one tablet by mouth twice daily.    dofetilide (TIKOSYN) 500 mcg capsule Take one capsule by mouth twice daily. Indications: atrial fibrillation electrically shocked to normal rhythm    escitalopram oxalate (LEXAPRO) 10 mg tablet Take 1.5 tablets by mouth daily.    FARXIGA 10 mg tablet Take one tablet by mouth daily.    lisinopriL (ZESTRIL) 10 mg tablet Take 1 tablet by mouth once daily    magnesium oxide (MAGOX) 400 mg (241.3 mg magnesium) tablet Take one tablet by mouth twice daily. Indications: Magnesium supplementation    metFORMIN (GLUCOPHAGE) 1,000 mg tablet Take one tablet by mouth twice daily with meals.    metoprolol succinate XL (TOPROL XL) 50 mg extended release tablet Take two tablets by mouth daily. Indications: ventricular rate control in atrial fibrillation    pioglitazone (ACTOS) 30 mg tablet Take one tablet by mouth daily.    tirzepatide (MOUNJARO) 10 mg/0.5 mL injector PEN Inject 0.5 mL under the skin every 7 days.

## 2023-03-18 ENCOUNTER — Encounter: Admit: 2023-03-18 | Discharge: 2023-03-18 | Payer: BC Managed Care – PPO

## 2023-03-18 MED ORDER — LISINOPRIL 10 MG PO TAB
ORAL_TABLET | 0 refills | Status: AC
Start: 2023-03-18 — End: ?

## 2023-04-19 ENCOUNTER — Encounter: Admit: 2023-04-19 | Discharge: 2023-04-19 | Payer: BC Managed Care – PPO

## 2023-05-28 ENCOUNTER — Encounter: Admit: 2023-05-28 | Discharge: 2023-05-28 | Payer: BC Managed Care – PPO

## 2023-05-28 MED ORDER — DOFETILIDE 500 MCG PO CAP
500 ug | ORAL_CAPSULE | Freq: Two times a day (BID) | ORAL | 0 refills
Start: 2023-05-28 — End: ?

## 2023-06-06 ENCOUNTER — Encounter: Admit: 2023-06-06 | Discharge: 2023-06-06 | Payer: BC Managed Care – PPO

## 2023-06-06 ENCOUNTER — Ambulatory Visit: Admit: 2023-06-06 | Discharge: 2023-06-07

## 2023-06-10 IMAGING — CR ELBOWCMLT
4 series · 4 of 4 positions shown · non-contrast
Comparison: none

[elbow lat]
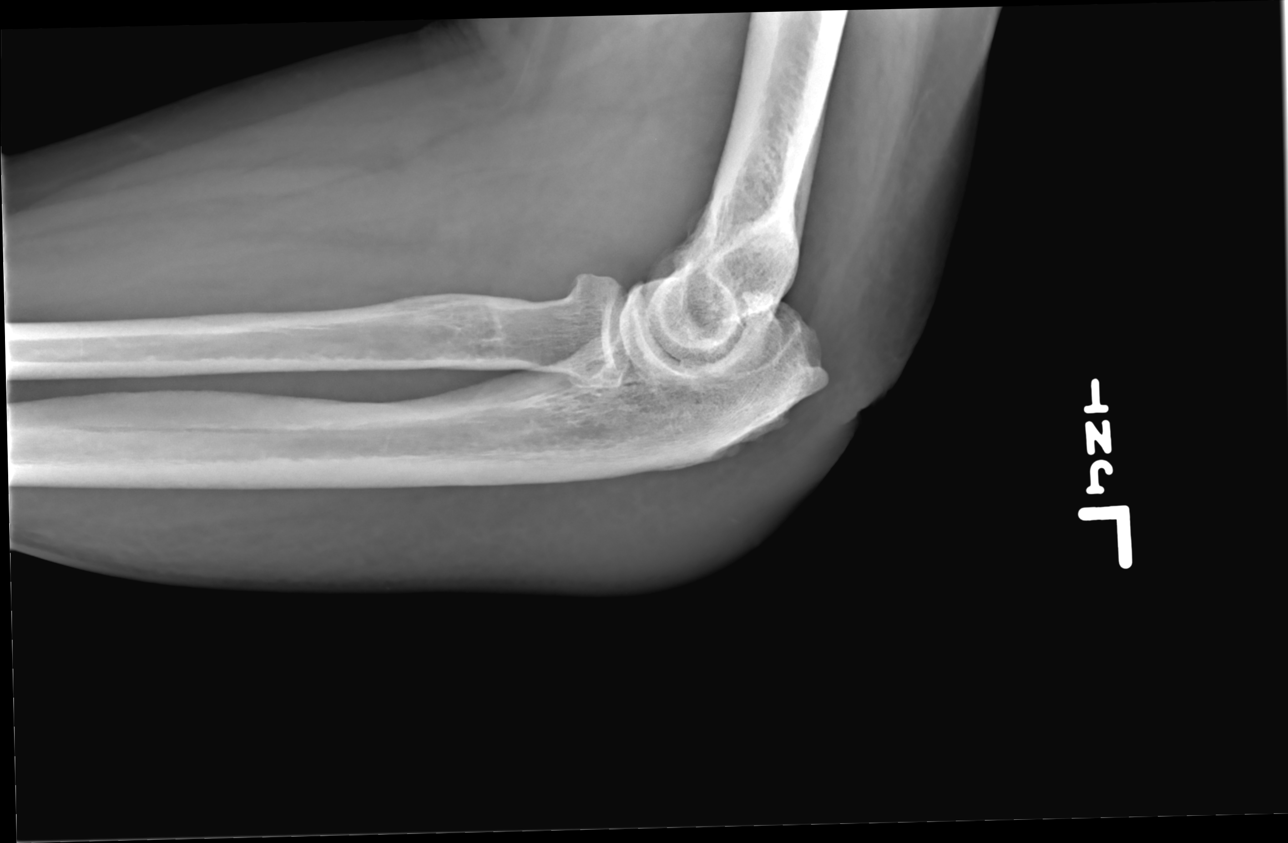

[elbow ap]
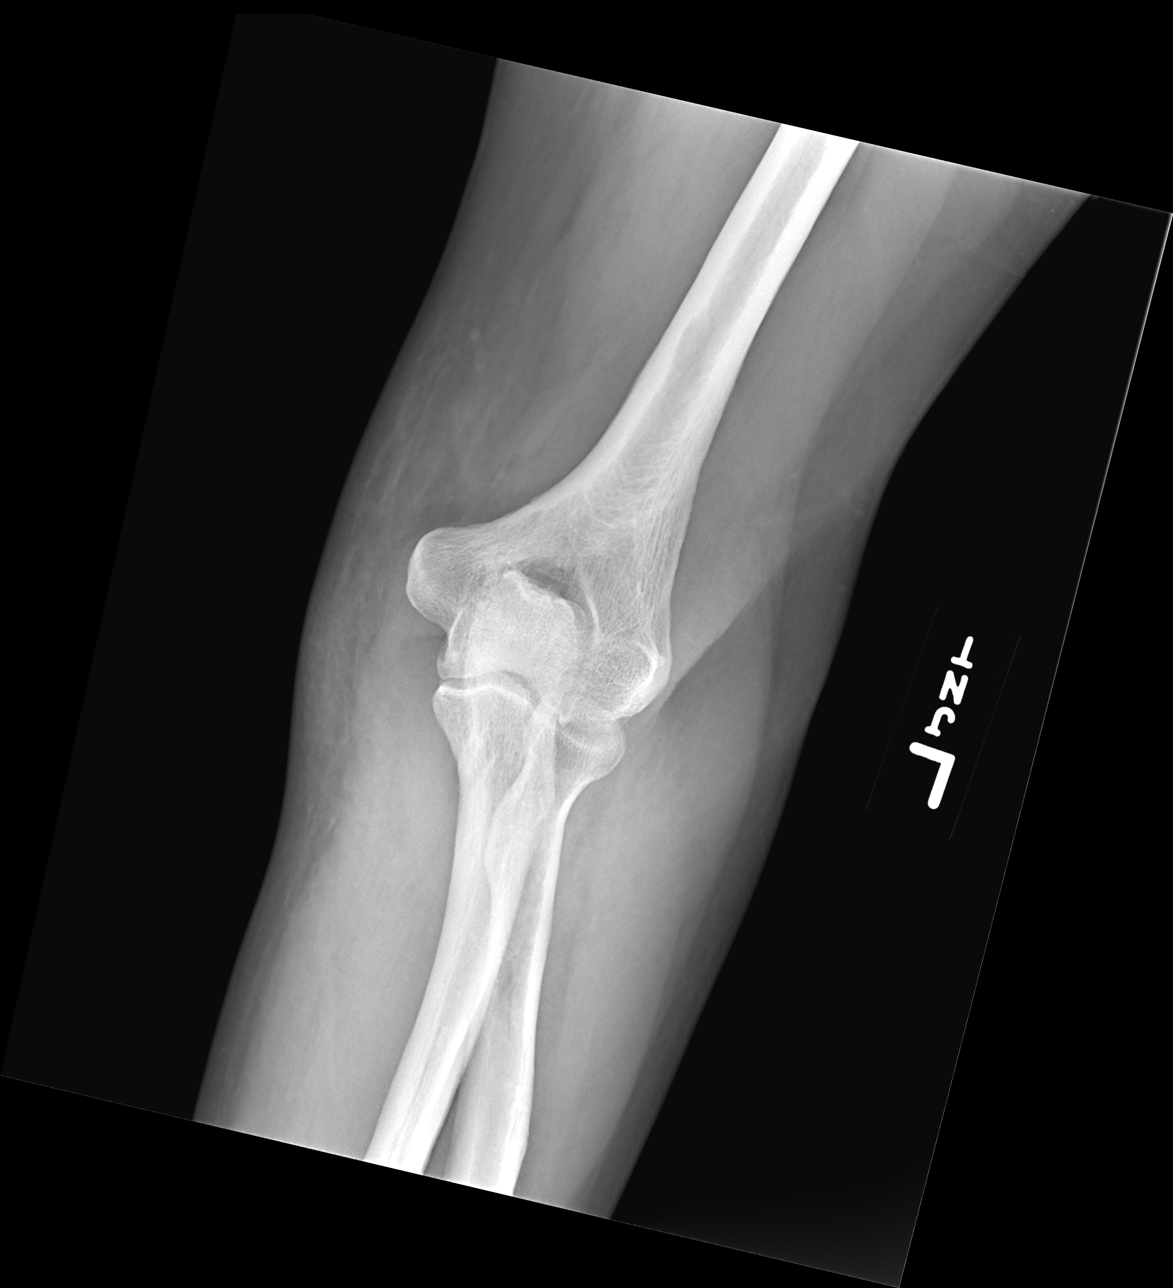

[elbow obl (1 of 2)]
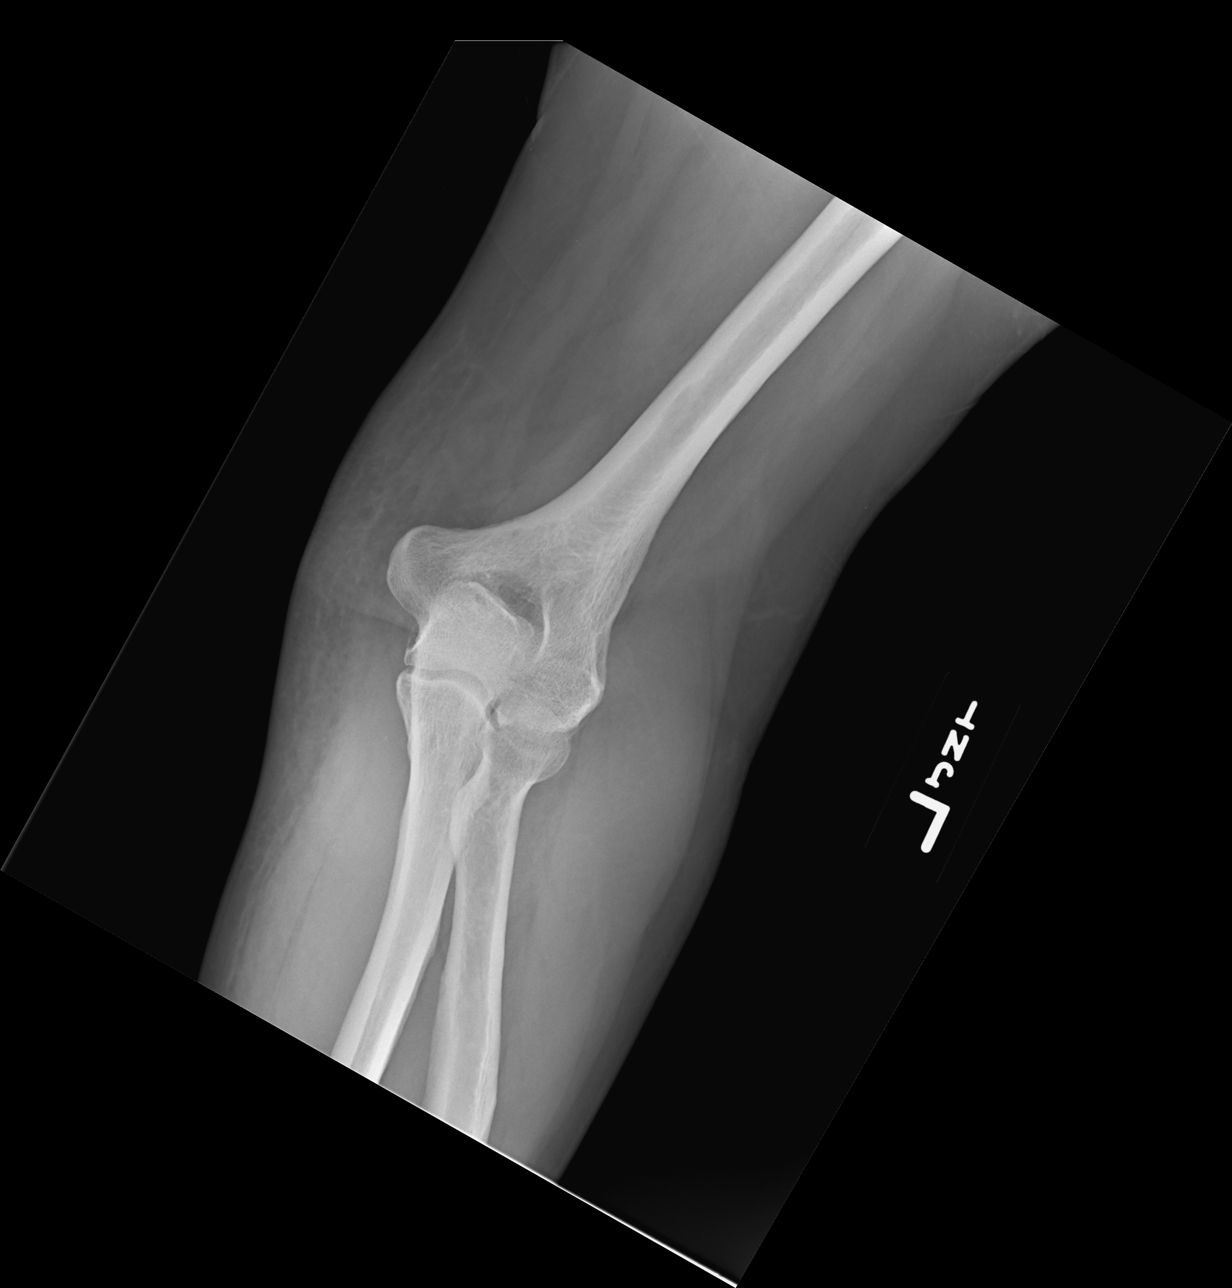

[elbow obl (2 of 2)]
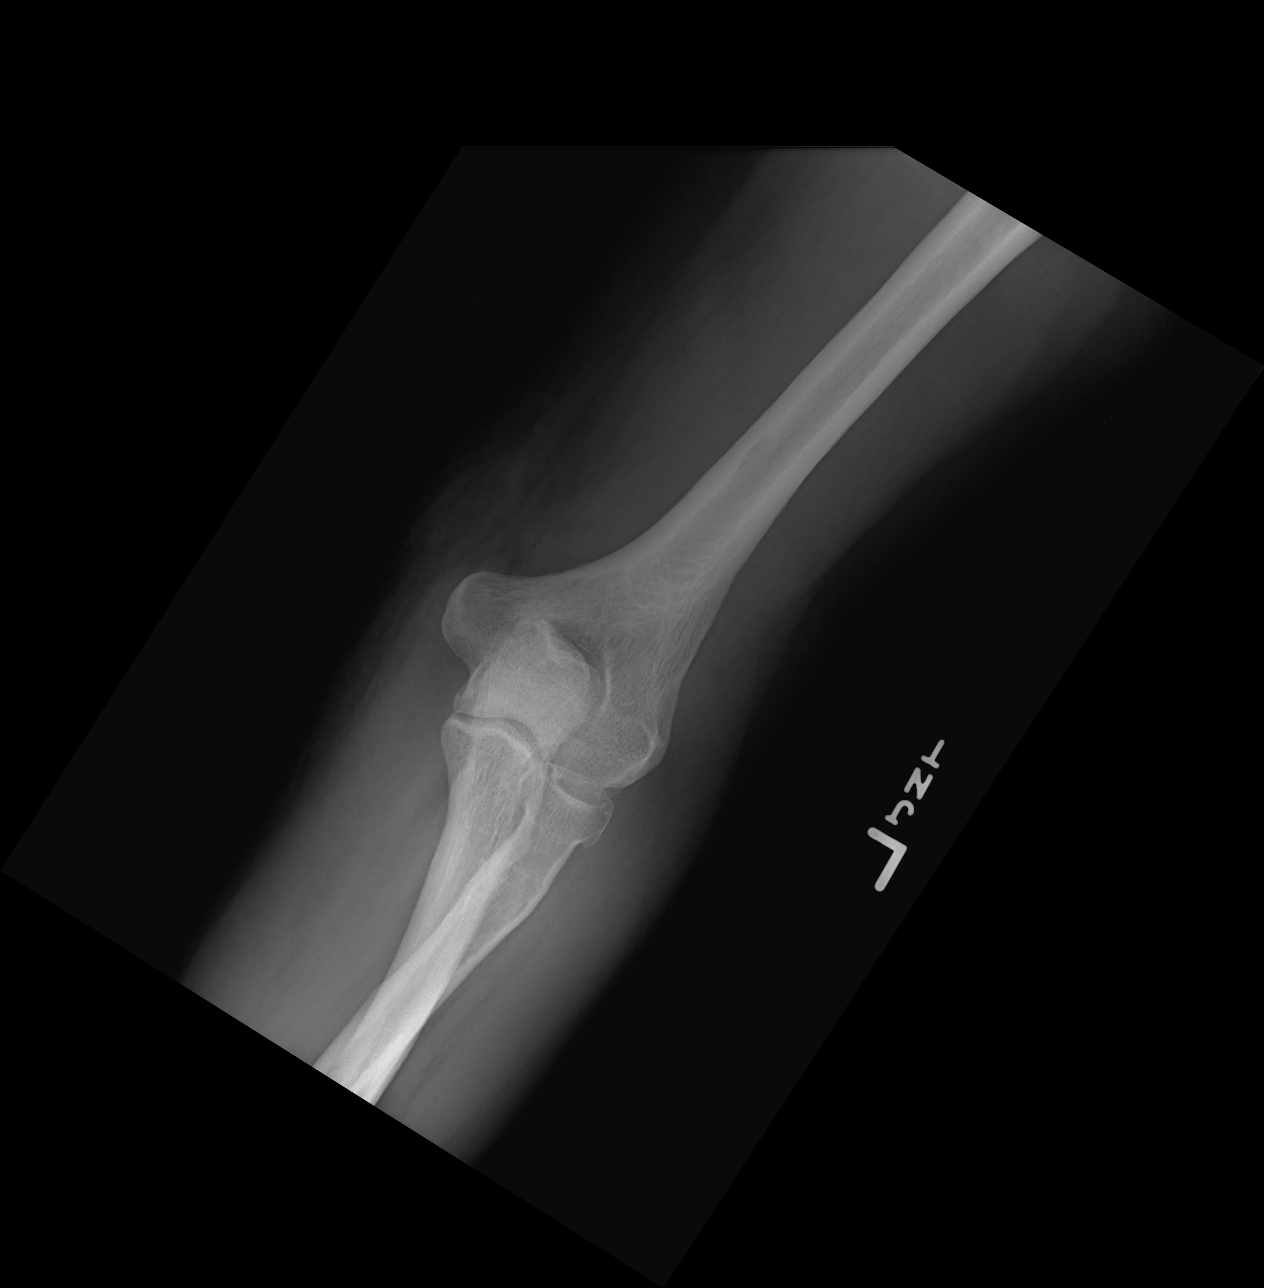

[4 of 4 positions shown; findings below may reference images not displayed]

DIAGNOSTIC STUDIES

EXAM

XR elbow LT min 3V

INDICATION

elbow pain
Pt c/o tenderness and swelling in his left elbow x 6 days ago. JT/CS

TECHNIQUE

XR elbow LT min 3V

COMPARISONS

06/01/2017

FINDINGS

No acute fracture or dislocation. There are subtle radial head and ulnar olecranon osteophytes
present.. No joint effusion is present. Overlying soft tissues are within normal limits.

IMPRESSION

No acute osseous findings.

Mild osteoarthritis.

Tech Notes:

Pt c/o tenderness and swelling in his left elbow x 6 days ago. JT/CS

## 2023-06-16 ENCOUNTER — Encounter: Admit: 2023-06-16 | Discharge: 2023-06-16 | Payer: BC Managed Care – PPO

## 2023-06-16 MED ORDER — LISINOPRIL 10 MG PO TAB
ORAL_TABLET | 3 refills | Status: AC
Start: 2023-06-16 — End: ?

## 2023-06-21 ENCOUNTER — Encounter: Admit: 2023-06-21 | Discharge: 2023-06-21 | Payer: BC Managed Care – PPO

## 2023-06-21 MED ORDER — DOFETILIDE 500 MCG PO CAP
500 ug | ORAL_CAPSULE | Freq: Two times a day (BID) | ORAL | 0 refills
Start: 2023-06-21 — End: ?

## 2023-06-21 NOTE — Telephone Encounter
Requested Prescriptions   Pending Prescriptions Disp Refills    dofetilide (TIKOSYN) 500 mcg capsule [Pharmacy Med Name: Dofetilide 500 MCG Oral Capsule] 300 capsule 0     Sig: Take 1 capsule by mouth twice daily       Off-Protocol Failed - 06/21/2023  7:34 AM        Failed - Valid encounter within last 12 months     Recent Visits  No visits were found meeting these conditions.  Showing recent visits within past 720 days and meeting all other requirements  Future Appointments  No visits were found meeting these conditions.  Showing future appointments within next 360 days and meeting all other requirements            Failed - Medication not assigned to a protocol, review manually.

## 2023-06-23 ENCOUNTER — Encounter: Admit: 2023-06-23 | Discharge: 2023-06-23 | Payer: BC Managed Care – PPO

## 2023-06-24 ENCOUNTER — Encounter: Admit: 2023-06-24 | Discharge: 2023-06-24 | Payer: BC Managed Care – PPO

## 2023-06-24 MED ORDER — DOFETILIDE 500 MCG PO CAP
500 ug | ORAL_CAPSULE | Freq: Two times a day (BID) | ORAL | 1 refills | Status: AC
Start: 2023-06-24 — End: ?

## 2023-06-28 ENCOUNTER — Encounter: Admit: 2023-06-28 | Discharge: 2023-06-28 | Payer: BC Managed Care – PPO

## 2023-06-28 NOTE — Telephone Encounter
Urgent Zio report uploaded and assigned to [facility]  Referring Physician: P. Wohletz    Preliminary Findings:   Atrial Fibrillation occurred continuously (100% burden), ranging from 73-  213 bpm (avg of 124 bpm). Some episodes of Atrial Fibrillation conducted  with possible aberrancy. Isolated VEs were rare (<1.0%, 33), and no VE  Couplets or VE Triplets were present. MD notification criteria for Rapid  Atrial Fibrillation met - report posted prior to notification per account  request (DR).

## 2023-07-05 ENCOUNTER — Encounter: Admit: 2023-07-05 | Discharge: 2023-07-05 | Payer: BC Managed Care – PPO

## 2023-07-05 DIAGNOSIS — I10 Essential (primary) hypertension: Secondary | ICD-10-CM

## 2023-07-05 DIAGNOSIS — I517 Cardiomegaly: Secondary | ICD-10-CM

## 2023-07-05 DIAGNOSIS — Z5181 Encounter for therapeutic drug level monitoring: Secondary | ICD-10-CM

## 2023-07-05 MED ORDER — POLYETHYLENE GLYCOL 3350 17 GRAM PO PWPK
1 | Freq: Every day | ORAL | 0 refills | Status: DC | PRN
Start: 2023-07-05 — End: 2023-07-09

## 2023-07-05 MED ORDER — ONDANSETRON HCL (PF) 4 MG/2 ML IJ SOLN
4 mg | INTRAVENOUS | 0 refills | Status: DC | PRN
Start: 2023-07-05 — End: 2023-07-06

## 2023-07-05 MED ORDER — DOFETILIDE 500 MCG PO CAP
500 ug | Freq: Two times a day (BID) | ORAL | 0 refills | Status: DC
Start: 2023-07-05 — End: 2023-07-09
  Administered 2023-07-06 – 2023-07-08 (×4): 500 ug via ORAL

## 2023-07-05 MED ORDER — INSULIN ASPART 100 UNIT/ML SC FLEXPEN
0-6 [IU] | Freq: Before meals | SUBCUTANEOUS | 0 refills | Status: DC
Start: 2023-07-05 — End: 2023-07-09
  Administered 2023-07-08: 18:00:00 1 [IU] via SUBCUTANEOUS

## 2023-07-05 MED ORDER — MELATONIN 5 MG PO TAB
5 mg | Freq: Every evening | ORAL | 0 refills | Status: DC | PRN
Start: 2023-07-05 — End: 2023-07-09

## 2023-07-05 MED ORDER — ONDANSETRON 4 MG PO TBDI
4 mg | ORAL | 0 refills | Status: DC | PRN
Start: 2023-07-05 — End: 2023-07-06

## 2023-07-05 MED ORDER — METOPROLOL SUCCINATE 100 MG PO TB24
100 mg | Freq: Every day | ORAL | 0 refills | Status: DC
Start: 2023-07-05 — End: 2023-07-09
  Administered 2023-07-06 – 2023-07-08 (×3): 100 mg via ORAL

## 2023-07-05 MED ORDER — APIXABAN 5 MG PO TAB
5 mg | Freq: Two times a day (BID) | ORAL | 0 refills | Status: DC
Start: 2023-07-05 — End: 2023-07-09
  Administered 2023-07-06 – 2023-07-08 (×6): 5 mg via ORAL

## 2023-07-05 MED ORDER — LISINOPRIL 10 MG PO TAB
10 mg | Freq: Every day | ORAL | 0 refills | Status: DC
Start: 2023-07-05 — End: 2023-07-09
  Administered 2023-07-08: 15:00:00 10 mg via ORAL

## 2023-07-05 MED ORDER — DAPAGLIFLOZIN PROPANEDIOL 10 MG PO TAB
10 mg | Freq: Every day | ORAL | 0 refills | Status: DC
Start: 2023-07-05 — End: 2023-07-09
  Administered 2023-07-06 – 2023-07-08 (×2): 10 mg via ORAL

## 2023-07-05 MED ORDER — SENNOSIDES-DOCUSATE SODIUM 8.6-50 MG PO TAB
1 | Freq: Every day | ORAL | 0 refills | Status: DC | PRN
Start: 2023-07-05 — End: 2023-07-09
  Administered 2023-07-08: 1 via ORAL

## 2023-07-05 MED ORDER — DEXTROSE 50 % IN WATER (D50W) IV SYRG
12.5-25 g | INTRAVENOUS | 0 refills | Status: DC | PRN
Start: 2023-07-05 — End: 2023-07-09

## 2023-07-05 MED ORDER — ESCITALOPRAM OXALATE 10 MG PO TAB
15 mg | Freq: Every day | ORAL | 0 refills | Status: DC
Start: 2023-07-05 — End: 2023-07-09
  Administered 2023-07-06 – 2023-07-08 (×3): 15 mg via ORAL

## 2023-07-06 ENCOUNTER — Inpatient Hospital Stay: Admit: 2023-07-06 | Discharge: 2023-07-06 | Payer: BC Managed Care – PPO

## 2023-07-06 ENCOUNTER — Encounter: Admit: 2023-07-06 | Discharge: 2023-07-06 | Payer: BC Managed Care – PPO

## 2023-07-06 ENCOUNTER — Inpatient Hospital Stay
Admit: 2023-07-06 | Discharge: 2023-07-08 | Disposition: A | Discharge status: Home / Self Care | Payer: BC Managed Care – PPO | Admitting: Student in an Organized Health Care Education/Training Program

## 2023-07-06 ENCOUNTER — Ambulatory Visit: Admit: 2023-07-06 | Discharge: 2023-07-06 | Payer: BC Managed Care – PPO

## 2023-07-06 ENCOUNTER — Inpatient Hospital Stay: Admit: 2023-07-06 | Discharge: 2023-07-07 | Payer: BC Managed Care – PPO

## 2023-07-06 DIAGNOSIS — I4891 Unspecified atrial fibrillation: Secondary | ICD-10-CM

## 2023-07-06 LAB — ECG 12-LEAD
P AXIS: 57 degrees
P-R INTERVAL: 194 ms
Q-T INTERVAL: 352 ms (ref 0.6–1)
Q-T INTERVAL: 388 ms
Q-T INTERVAL: 412 ms
QRS DURATION: 86 ms — ABNORMAL HIGH (ref 21–61)
QRS DURATION: 88 ms
QRS DURATION: 84 ms
QTC CALCULATION (BAZETT): 469 ms (ref 4.2–5.8)
QTC CALCULATION (BAZETT): 469 ms
QTC CALCULATION (BAZETT): 490 ms
R AXIS: 68 degrees — ABNORMAL LOW (ref 150–400)
R AXIS: 72 degrees
R AXIS: 48 degrees
T AXIS: 65 degrees (ref 7.0–11.0)
T AXIS: 50 degrees
T AXIS: 42 degrees
VENTRICULAR RATE: 107 {beats}/min — ABNORMAL HIGH (ref 13.5–16.5)
VENTRICULAR RATE: 88 {beats}/min
VENTRICULAR RATE: 85 {beats}/min

## 2023-07-06 LAB — CBC AND DIFF: ~~LOC~~ BKR WBC COUNT: 8.6 10*3/uL (ref 4.50–11.00)

## 2023-07-06 LAB — POC GLUCOSE
~~LOC~~ BKR POC GLUCOSE: 108 mg/dL — ABNORMAL HIGH (ref 70–100)
~~LOC~~ BKR POC GLUCOSE: 108 mg/dL — ABNORMAL HIGH (ref 70–100)
~~LOC~~ BKR POC GLUCOSE: 109 mg/dL — ABNORMAL HIGH (ref 70–100)
~~LOC~~ BKR POC GLUCOSE: 84 mg/dL (ref 70–100)
~~LOC~~ BKR POC GLUCOSE: 88 mg/dL (ref 70–100)

## 2023-07-06 LAB — BASIC METABOLIC PANEL
~~LOC~~ BKR CHLORIDE: 106 mmol/L (ref 98–110)
~~LOC~~ BKR SODIUM, SERUM: 137 mmol/L (ref 137–147)

## 2023-07-06 LAB — HEMOGLOBIN A1C: ~~LOC~~ BKR HEMOGLOBIN A1C: 7.5 % — ABNORMAL HIGH (ref 4.0–5.7)

## 2023-07-06 MED ORDER — DIPHENHYDRAMINE HCL 50 MG/ML IJ SOLN
25 mg | Freq: Once | INTRAVENOUS | 0 refills | Status: AC | PRN
Start: 2023-07-06 — End: ?

## 2023-07-06 MED ORDER — FENTANYL CITRATE (PF) 50 MCG/ML IJ SOLN
25 ug | INTRAVENOUS | 0 refills | Status: DC | PRN
Start: 2023-07-06 — End: 2023-07-08

## 2023-07-06 MED ORDER — SODIUM CHLORIDE 0.9 % IJ SOLN
10 mL | Freq: Once | INTRAVENOUS | 0 refills | Status: AC
Start: 2023-07-06 — End: ?

## 2023-07-06 MED ORDER — FENTANYL CITRATE (PF) 50 MCG/ML IJ SOLN
50 ug | INTRAVENOUS | 0 refills | Status: DC | PRN
Start: 2023-07-06 — End: 2023-07-08

## 2023-07-06 MED ORDER — METOCLOPRAMIDE HCL 5 MG/ML IJ SOLN
10 mg | Freq: Once | INTRAVENOUS | 0 refills | Status: AC | PRN
Start: 2023-07-06 — End: ?

## 2023-07-06 MED ORDER — TRIMETHOBENZAMIDE 100 MG/ML IM SOLN
200 mg | INTRAMUSCULAR | 0 refills | Status: DC | PRN
Start: 2023-07-06 — End: 2023-07-09

## 2023-07-06 MED ORDER — MAGNESIUM OXIDE 400 MG (241.3 MG MAGNESIUM) PO TAB
400 mg | Freq: Once | ORAL | 0 refills | Status: DC
Start: 2023-07-06 — End: 2023-07-07

## 2023-07-06 MED ORDER — PERFLUTREN LIPID MICROSPHERES 1.1 MG/ML IV SUSP
1-10 mL | Freq: Once | INTRAVENOUS | 0 refills | Status: AC | PRN
Start: 2023-07-06 — End: ?

## 2023-07-06 MED ORDER — MAGNESIUM OXIDE 400 MG (241.3 MG MAGNESIUM) PO TAB
400 mg | Freq: Two times a day (BID) | ORAL | 0 refills | Status: DC
Start: 2023-07-06 — End: 2023-07-09
  Administered 2023-07-07 – 2023-07-08 (×4): 400 mg via ORAL

## 2023-07-06 NOTE — Progress Notes
Internal Medicine Progress Note    Name:  Jeremy Bean   [id number] Date:  07/06/2023  Admission Date: 07/05/2023  LOS: 1 day                     Assessment/Plan:    Principal Problem:    Atrial fibrillation with rapid ventricular response (HCC)    Christophr Mileto is a 57 y.o. M with a history of A fib, DMII, HTN, OSA who was admitted directly for A fib w/RVR (noted on Zio patch despite compliance with Tikosyn).     A fib w/RVR  - Zio patch showing continuous A-fib ranging from 70s-200s bpm  - Patient asymptomatic, normally can tell when he is in A-fib  - EKG on admit confirmed A fib w/RVR  - TTE ordered  - Continue PTA Tikosyn, eliquis, metoprolol  - Cardiology consulted. NPO for cardioversion  - Monitor on telemetry  - Keep K>4, Mg >2    AKI - resolved  - Cr 1.48 at time of presentation (baseline ~1.1)  - PTA lisinopril held  - Cr back to baseline 12/4  - Avoid nephrotoxins  - CTM     HTN  - Continue PTA metoprolol XL 100mg  daily. Held PTA lisinopril 10mg  daily on admit due to AKI     MDD  - Continue PTA lexapro 15mg  daily     DMII  - Recently started on Mounjaro   - Hold PTA metformin, pioglitazone  - Continued PTA farxiga  - LDCF     OSA  - Continue CPAP    Class III obesity  - BMI 43  - Recently started on Mounjaro, hold while inpatient    FEN: No IVF, replace lytes PRN, cardiac diet - NPO for cardioversion  DVT ppx: eliquis  Code status: Full code    Dispo: Continue inpatient care      ________________________________________________________________________    Subjective    No acute events overnight. Patient stated he is feeling well. Has not had symptoms related to A fib and was surprised to get the phone call yesterday to come to the hospital. Understands plan for cardioversion today unless he spontaneously converts to normal rhythm.     Medications  Scheduled Meds:apixaban (ELIQUIS) tablet 5 mg, 5 mg, Oral, BID  dapagliflozin propanediol (FARXIGA) tablet 10 mg, 10 mg, Oral, QDAY  dofetilide (TIKOSYN) capsule 500 mcg, 500 mcg, Oral, BID  escitalopram oxalate (LEXAPRO) tablet 15 mg, 15 mg, Oral, QDAY  insulin aspart (U-100) (NOVOLOG FLEXPEN U-100 INSULIN) injection PEN 0-6 Units, 0-6 Units, Subcutaneous, ACHS (22)  [Held by Provider] lisinopriL (ZESTRIL) tablet 10 mg, 10 mg, Oral, QDAY  metoprolol succinate XL (TOPROL XL) tablet 100 mg, 100 mg, Oral, QDAY    Continuous Infusions:  PRN and Respiratory Meds:dextrose 50% (D50) IV PRN, melatonin QHS PRN, ondansetron Q6H PRN **OR** ondansetron (ZOFRAN) IV Q6H PRN, polyethylene glycol 3350 QDAY PRN, sennosides-docusate sodium QDAY PRN      Review of Systems:  All other systems reviewed and are negative.    Objective:                          Vital Signs: Last Filed                 Vital Signs: 24 Hour Range   BP: 140/77 (12/04 0500)  Temp: 36.4 ?C (97.6 ?F) (12/04 0500)  Pulse: 91 (12/04 0500)  Respirations: 18 PER MINUTE (  12/04 0500)  SpO2: 98 % (12/04 0500)  O2 Device: None (Room air) (12/04 0500)  Height: 185.4 cm (6' 1) (12/03 2017) BP: (140-150)/(77-84)   Temp:  [36.4 ?C (97.6 ?F)]   Pulse:  [91-104]   Respirations:  [18 PER MINUTE]   SpO2:  [97 %-98 %]   O2 Device: None (Room air)     Vitals:    07/05/23 2017 07/06/23 0500   Weight: (!) 148.3 kg (327 lb) (!) 148.3 kg (327 lb)       Intake/Output Summary:  (Last 24 hours)    Intake/Output Summary (Last 24 hours) at 07/06/2023 0630  Last data filed at 07/06/2023 0500  Gross per 24 hour   Intake 480 ml   Output 1600 ml   Net -1120 ml           Physical Exam  General appearance: Alert, cooperative and no distress  Head: NC/AT  Eyes: Conjunctivae/corneas clear  Neck: No JVD  Lungs: CTAB  Heart: Irregularly irregular  Abdomen: Soft, non-tender. Bowel sounds normal.   Extremities: No edema BLE  Neurologic: No focal deficit      Lab Review  24-hour labs:    Results for orders placed or performed during the hospital encounter of 07/05/23 (from the past 24 hours)   BASIC METABOLIC PANEL    Collection Time: 07/05/23 9:36 PM   Result Value Ref Range    Sodium 137 137 - 147 mmol/L    Potassium 4.5 3.5 - 5.1 mmol/L    Chloride 106 98 - 110 mmol/L    Glucose 104 (H) 70 - 100 mg/dL    Blood Urea Nitrogen 37 (H) 7 - 25 mg/dL    Creatinine 1.61 (H) 0.40 - 1.24 mg/dL    Calcium 9.2 8.5 - 09.6 mg/dL    CO2 21 21 - 30 mmol/L    Anion Gap 10 3 - 12    Glomerular Filtration Rate (GFR) 54 (L) >60 mL/min   CBC AND DIFF    Collection Time: 07/05/23  9:36 PM   Result Value Ref Range    White Blood Cells 8.60 4.50 - 11.00 10*3/uL    Red Blood Cells 4.63 4.40 - 5.50 10*6/uL    Hemoglobin 13.7 13.5 - 16.5 g/dL    Hematocrit 04.5 40.9 - 50.0 %    MCV 88.4 80.0 - 100.0 fL    MCH 29.6 26.0 - 34.0 pg    MCHC 33.5 32.0 - 36.0 g/dL    RDW 81.1 91.4 - 78.2 %    Platelet Count 217 150 - 400 10*3/uL    MPV 7.8 7.0 - 11.0 fL    Neutrophils 55 41 - 77 %    Lymphocytes 30 24 - 44 %    Monocytes 12 4 - 12 %    Eosinophils 1 0 - 5 %    Basophils 1 0 - 2 %    Absolute Neutrophil Count 4.70 1.80 - 7.00 10*3/uL    Absolute Lymph Count 2.60 1.00 - 4.80 10*3/uL    Absolute Monocyte Count 1.10 (H) 0.00 - 0.80 10*3/uL    Absolute Eosinophil Count 0.10 0.00 - 0.45 10*3/uL    Absolute Basophil Count 0.10 0.00 - 0.20 10*3/uL   MAGNESIUM    Collection Time: 07/05/23  9:36 PM   Result Value Ref Range    Magnesium 2.0 1.6 - 2.6 mg/dL   POC GLUCOSE    Collection Time: 07/05/23 10:57 PM   Result Value Ref Range  Glucose, POC 108 (H) 70 - 100 mg/dL   CBC AND DIFF    Collection Time: 07/06/23  3:53 AM   Result Value Ref Range    White Blood Cells 7.30 4.50 - 11.00 10*3/uL    Red Blood Cells 4.63 4.40 - 5.50 10*6/uL    Hemoglobin 14.1 13.5 - 16.5 g/dL    Hematocrit 57.8 46.9 - 50.0 %    MCV 88.7 80.0 - 100.0 fL    MCH 30.4 26.0 - 34.0 pg    MCHC 34.3 32.0 - 36.0 g/dL    RDW 62.9 52.8 - 41.3 %    Platelet Count 181 150 - 400 10*3/uL    MPV 7.5 7.0 - 11.0 fL    Neutrophils 51 41 - 77 %    Lymphocytes 35 24 - 44 %    Monocytes 11 4 - 12 %    Eosinophils 2 0 - 5 %    Basophils 1 0 - 2 %    Absolute Neutrophil Count 3.70 1.80 - 7.00 10*3/uL    Absolute Lymph Count 2.60 1.00 - 4.80 10*3/uL    Absolute Monocyte Count 0.80 0.00 - 0.80 10*3/uL    Absolute Eosinophil Count 0.20 0.00 - 0.45 10*3/uL    Absolute Basophil Count 0.10 0.00 - 0.20 10*3/uL   BASIC METABOLIC PANEL    Collection Time: 07/06/23  3:53 AM   Result Value Ref Range    Sodium 135 (L) 137 - 147 mmol/L    Potassium 4.1 3.5 - 5.1 mmol/L    Chloride 106 98 - 110 mmol/L    Glucose 101 (H) 70 - 100 mg/dL    Blood Urea Nitrogen 32 (H) 7 - 25 mg/dL    Creatinine 2.44 0.10 - 1.24 mg/dL    Calcium 9.1 8.5 - 27.2 mg/dL    CO2 21 21 - 30 mmol/L    Anion Gap 8 3 - 12    Glomerular Filtration Rate (GFR) >60 >60 mL/min   MAGNESIUM    Collection Time: 07/06/23  3:53 AM   Result Value Ref Range    Magnesium 1.9 1.6 - 2.6 mg/dL       Point of Care Testing  (Last 24 hours)  Glucose: (!) 101 (07/06/23 0353)  POC Glucose (Download): (!) 108 (07/05/23 2257)    Radiology and other Diagnostics Review:    Pertinent radiology reviewed.    [facility], DO   Pager

## 2023-07-06 NOTE — Interval H&P Note
Pre-Operative Assessment for TEE or Cardioversion    Date of Service:  07/06/2023  Jeremy Bean is a 57 y.o. y.o. male.     DOB: Dec 22, 1965                  MRN#:  [id number]      History and Physical Exam    I reviewed H&P from 07/05/23 .  No changes noted.    Assessment    I reviewed:    nurse pre-procedure assessment (including past medical history, allergies, medications, labs)  I assessed the patient's airways and noted:     no abnormalities    Physical Status Classification (American Society of Anesthesiologists):   ASA III (A patient with a severe systemic disease that limits activity, but is not incapacitating)    Sedation/Medication Plan    Sedation plan per Anesthesiology

## 2023-07-06 NOTE — Consults
Electrophysiology Consult Note:    Admission Date: 07/05/2023  Date of Consultation:  07/06/2023  LOS: 1 day  Requesting Physician: Harden Mo, DO  Consulting Physician:  Zollie Pee MD  Code Status: Full Code    Reason for Consultation  Opinion and recommendations regarding Afib RVR    Assessment:    #Persistent Atrial Fibrillation  #H/o AF, AFL Ablation  Mostly asymptomatic, possible fatigue, DOE  CHA2DS2-VASc Score: 1 for hypertension  10/24/2027 LAA PVI, CTI  06/19/2009 typical CCW atrial flutter ablation  AAD: previously Flecainide; currently dofetilide  AC: Eliquis 5mg  twice daily -> no missed doses confirmed  RC: Metoprolol XL 100mg  daily  Last Echo 2019: EF 50% with mild biatrial dilatation  Last stress 10/29/22: normal    #HTN  Pta metoprolol XL 100mg  daily, lisinopril 10mg  daily    #AKI - Resolved  Cr 1.48 on admit, now back to baseline    #OSA on CPAP    #T2DM  PTA  empagliflozin, metformin, pioglitazone    #Obesity  Recently started on Mounjaro - on hold    Recommendations:    NPO for DCCV - today around noon (in OR since on [place])  Continue uninterrupted Eliquis  Continue dofetilide, metoprolol XL  Echo after cardioversion  Will discuss options for AF control with patient if recurrence of AF (repeat ablation, amiodarone)  Patient will be seen with EP attending, Dr. Bernette Mayers, this afternoon. See attestation for additional recommendations.    Levonne Hubert, APRN-NP  Pager [phone] / Voalte  EP Pager [phone]      Commercial attestation  ________________________________________________________________  History of Present Illness:  Jeremy Bean is a 57 y.o. male patient with history of hypertension, obesity, obstructive sleep apnea, and paroxysmal atrial fibrillation status post ablation (10/2007) and residual typical flutter status post ablation (06/21/2009). He has previously been on Flecainide but was transitioned to dofetilide in May 2024 due to recurrent AF. He presented in AF and converted to SR spontaneously after his third dose. He was also on metoprolol XL 100mg . In mid-July, he reported presenting to [facility] and was told he was in atrial fibrillation with HR of 119. When he followed up with EP APP in clinic on 02/28/2023, he was back in NSR. Plans were to repeat a 7 day Ziopatch prior to follow up with Dr. Wallene Huh.  He wore the event monitor 11/7-14/2024. He was in atrial fibrillation/flutter 100% of the time. Rates ranged from 73-213bpm with average heart rate of 124bpm    He was advised to present to hospital on 07/05/2023 after Dr. Wallene Huh reviewed EVM. Plan to obtain repeat echocardiogram and consider DCCV.         Past Medical History:  Past Medical History:   Diagnosis Date    Atrial fibrillation (HCC) 2006    Cataract     Diabetes (HCC)     Enlarged LA (left atrium) 06/05/2009    History of hernia repair 06/05/2009    History of renal calculi 2009    Bilateral    History of rotator cuff surgery 06/05/2009    HTN (hypertension)     HX: anticoagulation     Obesity     Obstructive sleep apnea 06/05/2009         Social History:  Social History     Socioeconomic History    Marital status: Married   Tobacco Use    Smoking status: Never    Smokeless tobacco: Never   Vaping Use    Vaping status:  Never Used   Substance and Sexual Activity    Alcohol use: No    Drug use: No       Surgical History:  Surgical History:   Procedure Laterality Date    HX HEART CATHETERIZATION  2008    CARDIOVASCULAR STRESS TEST  11/2017    This study is mildly abnormal qualitatively.  There is subtle ischemia perhaps in a diagonal and in the right coronary artery territory.    ESOPHAGOSCOPY WITH ENDOSCOPIC ULTRASOUND EXAMINATION - FLEXIBLE N/A 07/28/2018    Performed by Vertell Novak, MD at [facility] ENDO    DOPPLER ECHOCARDIOGRAPHY      ELECTROCARDIOGRAM      EVENT MONITOR      HX APPENDECTOMY      TRANSESOPHAGEAL ECHO         Family History:  Family History   Problem Relation Name Age of Onset    Asthma Mother Arthritis-rheumatoid Mother      Stroke Mother      Diabetes Father      Stroke Father      Rashes/Skin Problems Father         Medications:  apixaban (ELIQUIS) tablet 5 mg, 5 mg, Oral, BID  dapagliflozin propanediol (FARXIGA) tablet 10 mg, 10 mg, Oral, QDAY  dofetilide (TIKOSYN) capsule 500 mcg, 500 mcg, Oral, BID  escitalopram oxalate (LEXAPRO) tablet 15 mg, 15 mg, Oral, QDAY  insulin aspart (U-100) (NOVOLOG FLEXPEN U-100 INSULIN) injection PEN 0-6 Units, 0-6 Units, Subcutaneous, ACHS (22)  [Held by Provider] lisinopriL (ZESTRIL) tablet 10 mg, 10 mg, Oral, QDAY  metoprolol succinate XL (TOPROL XL) tablet 100 mg, 100 mg, Oral, QDAY  sodium chloride PF 0.9% injection 10 mL, 10 mL, Intravenous, ONCE      dextrose 50% (D50) IV PRN, melatonin QHS PRN, ondansetron Q6H PRN **OR** ondansetron (ZOFRAN) IV Q6H PRN, perflutren lipid microspheres Once PRN **AND** sodium chloride PF 0.9% ONCE, polyethylene glycol 3350 QDAY PRN, sennosides-docusate sodium QDAY PRN    Allergies:  Allergies   Allergen Reactions    Glipizide MUSCLE PAIN       Review of Systems:  Const: denies recent fever, chills, or weight loss  Eyes:  denies changes in vision  Ears/Nose/Throat:  denies congestion or rhinorrhea  Cardiovascular:  denies chest pain or palpitations  Respiratory: denies dyspnea, orthopnea, PND or productive cough    Gastrointestinal:  denies N/V/D  Genitourinary:  denies dysuria or hematuria  Musculoskeletal:  denies muscle aches or pains  Skin: denies known rashes, lesions or sores  Neurologic:  denies dizziness, lightheadedness, syncope, or near-syncope  Endocrine:  denies polyuria or polydypsia  Heme: denies recent melena or hematochezia                           Vital Signs: Most Recent                 Vital Signs: 24 Hour Range   BP: 112/80 (12/04 0818)  Temp: 36.3 ?C (97.4 ?F) (12/04 0818)  Pulse: 86 (12/04 0818)  Respirations: 18 PER MINUTE (12/04 0818)  SpO2: 99 % (12/04 0818)  O2 Device: CPAP/BiPAP (12/04 0818)  Height: 185.4 cm (6' 1) (12/03 2017) BP: (112-150)/(77-84)   Temp:  [36.3 ?C (97.4 ?F)-36.4 ?C (97.6 ?F)]   Pulse:  [86-104]   Respirations:  [18 PER MINUTE]   SpO2:  [97 %-99 %]   O2 Device: CPAP/BiPAP     Vitals:    07/05/23 2017 07/06/23  0500   Weight: (!) 148.3 kg (327 lb) (!) 148.3 kg (327 lb)       Intake/Output Summary (Last 24 hours) at 07/06/2023 0855  Last data filed at 07/06/2023 1610  Gross per 24 hour   Intake 480 ml   Output 2075 ml   Net -1595 ml           Physical Exam:  GEN: well appearing 57 y.o. male who appears stated age and is no acute distress  HEENT: Unremarkable  LUNGS: respirations even and non-labored, CPAP while asleep.  HEART: irregularly irregular rhythm  ABD: large, round  NEUR: A&Ox3  PSYCH: calm and cooperative    Labs:  Hematology:    Lab Results   Component Value Date    HGB 14.1 07/06/2023    HGB 13.7 12/23/2022    HCT 41.1 07/06/2023    HCT 40.7 12/23/2022    PLTCT 181 07/06/2023    PLTCT 224 12/23/2022    WBC 7.30 07/06/2023    WBC 6.9 12/23/2022    NEUT 51 07/06/2023    NEUT 54 08/04/2018    ANC 3.70 07/06/2023    ANC 4.60 08/04/2018    ALC 2.60 07/06/2023    ALC 2.60 08/04/2018    MONA 11 07/06/2023    MONA 12 08/04/2018    AMC 0.80 07/06/2023    AMC 1.00 08/04/2018    EOSA 2 07/06/2023    EOSA 2 08/04/2018    ABC 0.10 07/06/2023    ABC 0.10 08/04/2018    MCV 88.7 07/06/2023    MCV 88.8 12/23/2022    MCH 30.4 07/06/2023    MCH 29.9 12/23/2022    MCHC 34.3 07/06/2023    MCHC 33.7 12/23/2022    MPV 7.5 07/06/2023    MPV 7.5 12/23/2022    RDW 13.6 07/06/2023    RDW 13.9 12/23/2022   , General Chemistry:    Lab Results   Component Value Date    NA 135 07/06/2023    NA 134 12/23/2022    K 4.1 07/06/2023    K 4.5 12/23/2022    CL 106 07/06/2023    CL 103 12/23/2022    CO2 21 07/06/2023    CO2 21 12/23/2022    GAP 8 07/06/2023    GAP 10 12/23/2022    BUN 32 07/06/2023    BUN 18 12/23/2022    CR 1.13 07/06/2023    CR 0.90 12/23/2022    GLU 101 07/06/2023    GLU 108 12/23/2022    CA 9.1 07/06/2023    CA 9.1 12/23/2022    ALBUMIN 4.1 12/21/2022    LACTIC 1.0 07/27/2018    OBSCA 1.16 12/22/2022    MG 1.9 07/06/2023    MG 2.3 12/23/2022    TOTBILI 0.4 12/21/2022    PO4 3.2 12/21/2022       ECG:  ECG shows AF, rate 107      Telemetry:  AF 90s      Levonne Hubert, APRN-C [phone])

## 2023-07-06 NOTE — Progress Notes
Report Sheet    DCCV           Indication: Afib   Ordering Provider: Janese Banks     Name: Jeremy Bean     Age: 57 y.o.    DOB: [birth year]     MRN: [id number]    RM #:   [facility] [id number]   RN:     Phone #:     Isolation: None     Communication Barrier:     NPO yes   A&O Yes   Travel Wch    IV SL   O2 RA   Additional Information:     Lab(s) needed:   Other:   Device check needed: None     Device: No results found for: GENERATOR, EPDEVTYP    Prior TEE:      Prior DCCV:     Prior Echo:   Previous TEE/DCCV details:     EF:   ECHO EF   Date Value Ref Range Status   07/31/2018 60 % Final       Anticoag: Eliquis 5 mg     Dose:     Frequency:     Last Dose:     Missed:    LAB:   Lab Results   Component Value Date/Time    INR 1.1 08/03/2018 05:27 AM    HGB 14.1 07/06/2023 03:53 AM    PLTCT 181 07/06/2023 03:53 AM    GLU 101 (H) 07/06/2023 03:53 AM    NA 135 (L) 07/06/2023 03:53 AM    K 4.1 07/06/2023 03:53 AM    CR 1.13 07/06/2023 03:53 AM       Probe   Gastric Surgery    GERD    Swallow difficulty    Head/Neck Surg/Rad    Chest Surg/Rad    EGD    Varices/Esophag CA    GI bleed    Dental issues      Sedation   COPD    OSA/CPAP    Asthma    Pulm HTN    Anesthesia Issues    Smoker    ETOH & Frequency    Drug Use    Chronic Pain Med      Current Medications:   No current outpatient medications on file.       Past Medical/Surgical History:   Patient Active Problem List    Diagnosis Date Noted    Atrial fibrillation with rapid ventricular response (HCC) 07/05/2023    Encounter for monitoring dofetilide therapy 12/21/2022    Atrial fibrillation (HCC) 07/26/2018    Abdominal pain 07/26/2018    OSA on CPAP 12/31/2013    Obstructive sleep apnea 06/05/2009    History of rotator cuff surgery 06/05/2009    S/P appendectomy 06/05/2009    History of hernia repair 06/05/2009    Coronary artery disease (CAD) excluded 06/05/2009    Enlarged LA (left atrium) 06/05/2009    AF (atrial fibrillation) (HCC) 10/24/2007    Hypertension 10/24/2007 Past Medical History:   Diagnosis Date    Atrial fibrillation (HCC) 2006    Cataract     Diabetes (HCC)     Enlarged LA (left atrium) 06/05/2009    History of hernia repair 06/05/2009    History of renal calculi 2009    Bilateral    History of rotator cuff surgery 06/05/2009    HTN (hypertension)     HX: anticoagulation     Obesity     Obstructive sleep apnea 06/05/2009  Surgical History:   Procedure Laterality Date    HX HEART CATHETERIZATION  2008    CARDIOVASCULAR STRESS TEST  11/2017    This study is mildly abnormal qualitatively.  There is subtle ischemia perhaps in a diagonal and in the right coronary artery territory.    ESOPHAGOSCOPY WITH ENDOSCOPIC ULTRASOUND EXAMINATION - FLEXIBLE N/A 07/28/2018    Performed by Vertell Novak, MD at [facility] ENDO    DOPPLER ECHOCARDIOGRAPHY      ELECTROCARDIOGRAM      EVENT MONITOR      HX APPENDECTOMY      TRANSESOPHAGEAL ECHO         Allergies:   Allergies   Allergen Reactions    Glipizide MUSCLE PAIN     Jillyn Ledger, RN

## 2023-07-06 NOTE — Progress Notes
HC5 END OF SHIFT/PLAN OF CARE NURSING NOTE   Nursing Shift: Night Shift 1900-0700    Acute events, nursing interventions, & communication with providers: new direct admit. Pt In afib with no symptoms rn      Patient Goal(s)  Patient will Verbalize readiness for discharge by the end of next shift.        Patient will  Verbalize readiness for discharge by discharge.   Admission Weight: Weight: (!) 148.3 kg (327 lb)    Last 3 Weights:   Vitals:    07/05/23 2017 07/06/23 0500   Weight: (!) 148.3 kg (327 lb) (!) 148.3 kg (327 lb)     Weight Change: Weight trend stable    Intake/Output Summary (Last 24 hours) at 07/06/2023 0553  Last data filed at 07/06/2023 0500  Gross per 24 hour   Intake 480 ml   Output 1600 ml   Net -1120 ml     Last Bowel Movement Date:  (pta)    Fluid Restriction? No   Quality/Safety    Total Fall Risk Score: 6   Risk for Injury related to falls: Coagulopathies/risk for bleed  Fall Risk Category:   History of More Than One Fall Within 6 Months Before Admission: No  Elimination, Bowel and Urine: N/A  Interventions: N/A - Does not score for risk in this category  Medications: On 2 or more high fall risk drugs  Interventions: Educate patient on medication side effects  Patient Care Equipment: One present  Interventions: N/A - Does not score as risk in this category  Mobility: 0 - No mobility issues  Interventions: N/A - Does not score as risk in this category  Cognition: 0 - No cognition issues  Interventions: N/A - Does not score as risk in this category    Other safety precautions in place: N/A    Restraints:  No      Patient Education  This RN provided education to Patient today. The following education topics were reviewed:  Quality/Safety Education:   Pain scale  Medication Education:   Cardiac medication(s)  Education provided on the following medication(s): tikosyn  Cardiac - Specific Education:   Cardiac diagnosis specific education: afib  General Education:   Condition specific education: afib    The following teaching method(s) were used: Verbal  Response to learning: [employer] Understanding  Needs reinforcement on: NA

## 2023-07-06 NOTE — Progress Notes
RT Adult Assessment Note    NAME:Jeremy Bean             MRN: [id number]             DOB:01-05-1966          AGE: 57 y.o.  ADMISSION DATE: 07/05/2023             DAYS ADMITTED: LOS: 0 days    Additional Comments:  Impressions of the patient: pt laying in bed, in no apparent respiratory distress    Vital Signs:  Pulse: 102  RR: 18 PER MINUTE  SpO2: 97 %  O2 Device: None (Room air)  Liter Flow:    O2%:      Breath Sounds:   Breath Sounds WDL: Within Defined Limits  Respiratory Effort:   Respiratory WDL: Within Defined Limits  Comments:

## 2023-07-06 NOTE — H&P (View-Only)
Admission History and Physical Examination      Name:  Jeremy Bean                                             MRN:  [id number]   Admission Date:  07/05/2023                     Assessment/Plan   Principal Problem:    Atrial fibrillation with rapid ventricular response [facility])    57 year old male with PMH significant for atrial fibrillation (on AC, Tikosyn), T2DM, HTN, OSA who was admitted directly for Afib RVR after Zio patch reported patient's failed Tikosyn.    A-fib RVR  Zio patch showing continuous A-fib ranging from 70s-200s bpm  Patient asymptomatic, normally can tell when he is in A-fib  Plan  Admit to telemetry  Obtain ECG  Obtain basic labs, replace lytes prn  K>4, Mg >2  Consult cardiology EP, appreciate recs  Continue PTA Tikosyn, Eliquis, Toprol  N.p.o. midnight for possible cardioversion  Obtain echo in a.m.    HTN  Continue PTA lisinopril, metoprolol    MDD  Continue PTA escitalopram    T2DM  Morbid obesity  Recently started on Ozempic  Hold PTA metformin, pioglitazone  Continue PTA tobacco follows  Add insulin sliding scale LDCF  Glucose ACHS    OSA  Continue CPAP      FEN: n/a, lytes PRN, DIET NPO AT MIDNIGHT Sips With Medications  Lines/Drains/Airways: PIV x 1  DVT PPX: Apixaban  CODE STATUS: FULL CODE  DISPO: Admit to medicine as inpatient class for management of above medical issues.    PCP: Sherene Sires Fnp-C  Future Appointments   Date Time Provider Department Center   10/12/2023  1:00 PM Dendi, Raghuveer, MD [facility] Exam         Billing  Total Time Today was 75 minutes in the following activities: Preparing to see the patient, Obtaining and/or reviewing separately obtained history, Performing a medically appropriate examination and/or evaluation, Counseling and educating the patient/family/caregiver, Ordering medications, tests, or procedures, Referring and communication with other health care professionals, Documenting clinical information, Independently interpreting results and communicating results to the patient/family/caregiver, and in Care coordination.     Rosalie Doctor, DO  Clinical Assistant Professor  Department of Internal Medicine  Service: Med Private Swing 3  Voalte/AMS available     Amie Critchley is the preferred method of communication.   Please use the Med Private First Call for all patient-related communications.   Personal Voaltes and pagers are not answered at all hours.    __________________________________________________________________________________  Primary Care Physician: Sherene Sires Fnp-C    History of Present Illness     57 year old male with PMH significant for atrial fibrillation (on AC, Tikosyn), T2DM, HTN, OSA who was admitted directly for Afib RVR after Zio patch reported patient's failed Tikosyn and was continuously in A-fib ranging from 73-213 bpm with an average 124.  Patient was called and instructed to come in.  When seen in room he states that he is asymptomatic.  He denies palpitations, chest pain, shortness of breath, other symptoms.  Endorses compliance with Tikosyn and Eliquis. He was recently placed on Ozempic for obesity and diabetes which he feels has been helping with appetite.  States he has presented for cardioversion multiple times before but usually self converts to normal  sinus rhythm before he can be cardioverted.  Denies any smoking, alcohol, drugs.  Open walking around room without difficulty.    Past Medical, Surgical, Family and Social History     Past Medical History:   Diagnosis Date    Atrial fibrillation (HCC) 2006    Cataract     Diabetes (HCC)     Enlarged LA (left atrium) 06/05/2009    History of hernia repair 06/05/2009    History of renal calculi 2009    Bilateral    History of rotator cuff surgery 06/05/2009    HTN (hypertension)     HX: anticoagulation     Obesity     Obstructive sleep apnea 06/05/2009     Surgical History:   Procedure Laterality Date    HX HEART CATHETERIZATION  2008 CARDIOVASCULAR STRESS TEST  11/2017    This study is mildly abnormal qualitatively.  There is subtle ischemia perhaps in a diagonal and in the right coronary artery territory.    ESOPHAGOSCOPY WITH ENDOSCOPIC ULTRASOUND EXAMINATION - FLEXIBLE N/A 07/28/2018    Performed by Vertell Novak, MD at [facility] ENDO    DOPPLER ECHOCARDIOGRAPHY      ELECTROCARDIOGRAM      EVENT MONITOR      HX APPENDECTOMY      TRANSESOPHAGEAL ECHO       Family history reviewed; non-contributory  Social History     Tobacco Use    Smoking status: Never    Smokeless tobacco: Never   Vaping Use    Vaping status: Never Used   Substance and Sexual Activity    Alcohol use: No    Drug use: No             Immunizations (includes history and patient-reported)       There is no immunization history on file for this patient.          Allergies     Glipizide    Medications     Medications Prior to Admission   Medication Sig    apixaban (ELIQUIS) 5 mg tablet Take one tablet by mouth twice daily.    dofetilide (TIKOSYN) 500 mcg capsule Take one capsule by mouth twice daily. Indications: atrial fibrillation electrically shocked to normal rhythm    escitalopram oxalate (LEXAPRO) 10 mg tablet Take 1.5 tablets by mouth daily.    FARXIGA 10 mg tablet Take one tablet by mouth daily.    lisinopriL (ZESTRIL) 10 mg tablet Take 1 tablet by mouth once daily    magnesium oxide (MAGOX) 400 mg (241.3 mg magnesium) tablet Take one tablet by mouth twice daily. Indications: Magnesium supplementation    metFORMIN (GLUCOPHAGE) 1,000 mg tablet Take one tablet by mouth twice daily with meals.    metoprolol succinate XL (TOPROL XL) 50 mg extended release tablet Take two tablets by mouth daily. Indications: ventricular rate control in atrial fibrillation    pioglitazone (ACTOS) 30 mg tablet Take one tablet by mouth daily.    tirzepatide (MOUNJARO) 10 mg/0.5 mL injector PEN Inject 0.5 mL under the skin every 7 days.       Review of Systems     Constitutional: negative for fevers, chills, and weight loss  Eyes: negative  Ears, nose, mouth, throat, and face: negative for nasal congestion, sore throat  Respiratory: negative for cough, wheezing, or dyspnea  Cardiovascular: negative for chest pain, palpitations, lower extremity edema  Gastrointestinal: negative for nausea, vomiting, diarrhea, and abdominal pain  Genitourinary: negative for dysuria and hematuria  Hematologic/lymphatic: negative  Musculoskeletal: negative for arthralgias and myalgias  Neurological: negative for headaches, paresthesia, and focal weakness  Endocrine: negative     Physical Exam                            Vital Signs: Last Filed                 Vital Signs: 24 Hour Range   BP: 150/84 (12/03 2017)  Temp: 36.4 ?C (97.6 ?F) (12/03 2017)  Pulse: 104 (12/03 2017)  Respirations: 18 PER MINUTE (12/03 2017)  SpO2: 98 % (12/03 2017)  O2 Device: None (Room air) (12/03 2017)  Height: 185.4 cm (6' 1) (12/03 2017) BP: (150)/(84)   Temp:  [36.4 ?C (97.6 ?F)]   Pulse:  [104]   Respirations:  [18 PER MINUTE]   SpO2:  [98 %]   O2 Device: None (Room air)     Vitals:    07/05/23 2017   Weight: (!) 148.3 kg (327 lb)             No intake or output data in the 24 hours ending 07/05/23 2026    GEN: Sitting  in bed, appears stable, awake, alert, NAD. Morbid obesity  HEENT: Nares patent, moist MM, normocephalic and atraumatic  CV: Tachycardic rate and irregular rhythm, no murmur, rubs, gallops, no appreciated JVD  ABD: normal contours, soft, non-distended, NTTP  PULM: CTAB, No wheezes, rales, rhonchi. No increased effort  EXT: no cyanosis, non-tender to palpation, trace edema noted  NEURO: alert and answers questions appropriately, tracks to voice, speech clear, face symmetric, motor strength appears at baseline  PSYCH: pleasant affect, thought process linear  SKIN: warm and dry to touch, no grossly visible rash    Labs/Radiology/Diagnostic Tests     24-hour labs:  labs pending  Pertinent radiology reviewed.      Rosalie Doctor, DO  Clinical Assistant Professor  Department of Internal Medicine

## 2023-07-07 ENCOUNTER — Encounter: Admit: 2023-07-07 | Discharge: 2023-07-07 | Payer: BC Managed Care – PPO

## 2023-07-07 ENCOUNTER — Inpatient Hospital Stay: Admit: 2023-07-07 | Discharge: 2023-07-07 | Payer: BC Managed Care – PPO

## 2023-07-07 LAB — 2D + DOPPLER ECHO
AORTIC VALVE STROKE VOLUME INDEX: 23
ASCENDING AORTA: 3.1 cm
AV INDEX (NATIVE): 0.7
AV PEAK VELOCITY: 1.1 m/s (ref 0–5)
BSA: 2.7 m2
DOP CALC LVOT AREA: 3.8 cm2
DOP CALC LVOT PEAK VEL: 0.8 m/s
DOP CALC LVOT STROKE VOLUME: 56 cm3
ECHO EF: 45 %
EJECTION FRACTION: 40 %
FRACTIONAL SHORTENING: 23 % (ref 28–44)
LEFT ATRIUM INDEX: 38 mL/m2 (ref 16–34)
LEFT ATRIUM SIZE: 5.3 cm (ref 3–4)
LEFT ATRIUM VOLUME: 105 mL (ref 18–58)
LEFT VENTRICLE DIASTOLIC VOLUME INDEX: 56 mL/m2 (ref 34–74)
LEFT VENTRICLE MASS INDEX: 67 g/m2 (ref 49–115)
LEFT VENTRICLE SYSTOLIC VOLUME INDEX: 32 mL/m2 (ref 11–31)
LEFT VENTRICULAR MASS: 186 g (ref 88–224)
MV PEAK E VEL PW: 0.7 m/s (ref 1.80–7.00)
POSTERIOR WALL: 1 cm (ref 0.6–1)
RELATIVE WALL THICKNESS: 0.3 (ref <=0.42)
RIGHT ATRIAL AREA: 21 cm2 (ref 0.00–<18)
RIGHT HEART SYSTOLIC MMODE TAPSE: 2.1 cm (ref 1.00–>1.7)
RIGHT HEART SYSTOLIC TDI S': 0.1 m/s
RIGHT VENTRICULAR BASAL DIAMETER: 5.3 cm (ref 2.5–4.1)
RIGHT VENTRICULAR MID DIAMETER: 4.4 cm — ABNORMAL HIGH (ref 1.9–3.5)
RV SYSTOLIC PRESSURE: 18
SIMPSONS BIPLANE EF: 42 %
SINUS: 3.7 cm (ref 2.8–4)
TR PEAK VELOCITY: 2.1 m/s

## 2023-07-07 LAB — ECG 12-LEAD
P AXIS: 106 degrees
Q-T INTERVAL: 406 ms
QRS DURATION: 90 ms
QTC CALCULATION (BAZETT): 515 ms
R AXIS: 74 degrees
T AXIS: 55 degrees
VENTRICULAR RATE: 97 {beats}/min

## 2023-07-07 LAB — POC GLUCOSE
~~LOC~~ BKR POC GLUCOSE: 90 mg/dL (ref 70–100)
~~LOC~~ BKR POC GLUCOSE: 108 mg/dL — ABNORMAL HIGH (ref 70–100)
~~LOC~~ BKR POC GLUCOSE: 114 mg/dL — ABNORMAL HIGH (ref 70–100)
~~LOC~~ BKR POC GLUCOSE: 127 mg/dL — ABNORMAL HIGH (ref 70–100)

## 2023-07-07 LAB — POC ACTIVATED CLOTTING TIME (ISTAT)
~~LOC~~ BKR POCT ACT ISTAT: 320 s
~~LOC~~ BKR POCT ACT ISTAT: 360 s

## 2023-07-07 MED ORDER — HEPARIN (PORCINE) 2,000 UNIT/NS 1000 ML SOLP (EP)(VENOUS SHEATH)
0 refills | Status: DC
Start: 2023-07-07 — End: 2023-07-07

## 2023-07-07 MED ORDER — HEPARIN (PORCINE) 1,000 UNIT/ML IJ SOLN
0 refills | Status: DC
Start: 2023-07-07 — End: 2023-07-07

## 2023-07-07 MED ORDER — FENTANYL CITRATE (PF) 50 MCG/ML IJ SOLN
12.5 ug | INTRAVENOUS | 0 refills | Status: DC | PRN
Start: 2023-07-07 — End: 2023-07-08

## 2023-07-07 MED ORDER — HEPARIN (PORCINE) 2,000 UNIT/NS 1000 ML SOLP (EP)(TRANSEPTAL SHEATH)
0 refills | Status: DC
Start: 2023-07-07 — End: 2023-07-07

## 2023-07-07 MED ORDER — PROTAMINE 10 MG/ML IV SOLN
0 refills | Status: DC
Start: 2023-07-07 — End: 2023-07-07

## 2023-07-07 MED ORDER — LIDOCAINE (PF) 10 MG/ML (1 %) IJ SOLN
.2 mL | INTRAMUSCULAR | 0 refills | Status: DC | PRN
Start: 2023-07-07 — End: 2023-07-08

## 2023-07-07 MED ORDER — HEPARIN (PORCINE) 2,000 UNIT/NS 1000 ML SOLP (EP)(IRRIGATION ABLATION CATHETER)
0 refills | Status: DC
Start: 2023-07-07 — End: 2023-07-07

## 2023-07-07 MED ORDER — PERFLUTREN LIPID MICROSPHERES 1.1 MG/ML IV SUSP
1-10 mL | Freq: Once | INTRAVENOUS | 0 refills | Status: CP | PRN
Start: 2023-07-07 — End: ?
  Administered 2023-07-07: 16:00:00 3 mL via INTRAVENOUS

## 2023-07-07 MED ORDER — LIDOCAINE HCL 2 % MM JELP
Freq: Once | TOPICAL | 0 refills | Status: DC
Start: 2023-07-07 — End: 2023-07-08

## 2023-07-07 MED ORDER — IOHEXOL 350 MG IODINE/ML IV SOLN
100 mL | Freq: Once | INTRAVENOUS | 0 refills | Status: CP
Start: 2023-07-07 — End: ?
  Administered 2023-07-07: 15:00:00 100 mL via INTRAVENOUS

## 2023-07-07 MED ORDER — SODIUM CHLORIDE 0.9 % IJ SOLN
50 mL | Freq: Once | INTRAVENOUS | 0 refills | Status: CP
Start: 2023-07-07 — End: ?
  Administered 2023-07-07: 15:00:00 50 mL via INTRAVENOUS

## 2023-07-07 MED ORDER — HEPARIN (PORCINE) IN 5 % DEX 20,000 UNIT/500 ML (40 UNIT/ML) IV SOLP
0 refills | Status: DC
Start: 2023-07-07 — End: 2023-07-07

## 2023-07-07 MED ORDER — SODIUM CHLORIDE 0.9 % IJ SOLN
10 mL | Freq: Once | INTRAVENOUS | 0 refills | Status: CP
Start: 2023-07-07 — End: ?
  Administered 2023-07-07: 16:00:00 10 mL via INTRAVENOUS

## 2023-07-07 MED ORDER — FENTANYL CITRATE (PF) 50 MCG/ML IJ SOLN
50 ug | Freq: Once | INTRAVENOUS | 0 refills | Status: AC | PRN
Start: 2023-07-07 — End: ?

## 2023-07-07 MED ORDER — BUPIVACAINE (PF) 0.25 % (2.5 MG/ML) IJ SOLN
0 refills | Status: DC
Start: 2023-07-07 — End: 2023-07-07

## 2023-07-07 MED ADMIN — SODIUM CHLORIDE 0.9% IV SOLP [27838]: .4 ug/kg/min | INTRAVENOUS | @ 20:00:00 | Stop: 2023-07-07 | NDC 00338004902

## 2023-07-07 MED ADMIN — PHENYLEPHRINE HCL 10 MG/ML IJ SOLN [6242]: .4 ug/kg/min | INTRAVENOUS | @ 20:00:00 | Stop: 2023-07-07 | NDC 70121157701

## 2023-07-07 NOTE — Procedures
Cardiac Electrophysiology Brief Post-Procedure Note    Attending: Marya Amsler, MD    EP FELLOW: Worthy Keeler, DO    Preoperative diagnosis:   #Persistent Atrial Fibrillation  #H/o AF, AFL Ablation  #HTN   #OSA on CPAP  #T2DM  #Obesity, class III    Postoperative diagnosis: Same as above    Procedure(s) Performed: Persistent atrial fibrillation ablation - PFA (PVI and PWI)    Procedure Description: Successful Persistent atrial fibrillation ablation - PFA (PVI and PWI)    Procedure Findings:   -Please review full operative report for full details of the procedure    Anesthesia: General anesthesia     Estimated blood loss: 20 mL    Complications: None     Specimens removed: None     ** Ultrasound Guided Access with Micropuncture **  Access Point  Sheath Size  Catheter(s) Location(s)   Right femoral vein    -Figure of 8 suture applied- FaraDrive Sheath Farawave PFA ablation catheter  Octaray Short Spline LA, RA   Left femoral vein    -Figure of 8 suture applied- 11 short ICE RA, RV   Left femoral vein    -Figure of 8 suture applied- 8 short Decapolar CS RA,  CS     Post-AF Ablation Recommendations:  - Pain control as needed  - Loosen 3-way stopcock at site(s) of figure-of-eight suture 3.5 hrs after sheath removal -- can cut/remove suture if doing well after ambulation if going home same day otherwise cut/remove tomorrow morning  - Remove figure-of-eight suture tomorrow if femoral access site(s) doing well  - Bedrest 6 hours after sheath removal  - Anti-arrhythmic drug: Resume/start Tikosyn until clinic follow-up with attending  - Rate control medication: Resume/start Toprol XL  -Anticoagulation: Resume apixaban (Eliquis) as soon as 4 hours after sheath removal if hemostasis present and no hematoma.  - No PPI necessary   - Follow-up: EP APP in 1 month and Dr. Wallene Huh in 3 months

## 2023-07-07 NOTE — Progress Notes
Pt flipped back to afib at around 2100 this evening after having a cardioversion today 12/4. Pt asymptomatic. Nehemiah Settle, MD notified. No new orders at this time.

## 2023-07-07 NOTE — Progress Notes
HC5 END OF SHIFT/PLAN OF CARE NURSING NOTE   Nursing Shift: Night Shift 1900-0700    Acute events, nursing interventions, & communication with providers: Pt In afib with no symptoms rn      Patient Goal(s)  Patient will Verbalize readiness for discharge by the end of next shift.        Patient will  Verbalize readiness for discharge by discharge.   Admission Weight: Weight: (!) 148.3 kg (327 lb)    Last 3 Weights:   Vitals:    07/05/23 2017 07/06/23 0500   Weight: (!) 148.3 kg (327 lb) (!) 148.3 kg (327 lb)     Weight Change: Weight trend stable    Intake/Output Summary (Last 24 hours) at 07/07/2023 0452  Last data filed at 07/07/2023 0400  Gross per 24 hour   Intake 600 ml   Output 3225 ml   Net -2625 ml     Last Bowel Movement Date:  (pta)    Fluid Restriction? No   Quality/Safety    Total Fall Risk Score: 11   Risk for Injury related to falls: Coagulopathies/risk for bleed  Fall Risk Category:   History of More Than One Fall Within 6 Months Before Admission: No  Elimination, Bowel and Urine: N/A  Interventions: N/A - Does not score for risk in this category  Medications: Sedated procedure within past 24 hours  Interventions: Educate patient on medication side effects  Patient Care Equipment: N/A  Interventions: N/A - Does not score as risk in this category  Mobility: 2 - Assistance required, 2 - Unsteady gait  Interventions: N/A - Does not score as risk in this category  Cognition: 0 - No cognition issues  Interventions: N/A - Does not score as risk in this category    Other safety precautions in place: N/A    Restraints:  No      Patient Education  This RN provided education to Patient today. The following education topics were reviewed:  Quality/Safety Education:   Pain scale  Medication Education:   Cardiac medication(s)  Education provided on the following medication(s): tikosyn  Cardiac - Specific Education:   Cardiac diagnosis specific education: afib  General Education:   Condition specific education: afib    The following teaching method(s) were used: Verbal  Response to learning: [employer] Understanding  Needs reinforcement on: NA

## 2023-07-07 NOTE — Case Management (ED)
Case Management Admission Assessment    NAME:Jeremy Bean.                          MRN: [id number]             DOB:[birth year]          AGE: 57 y.o.  ADMISSION DATE: 07/05/2023             DAYS ADMITTED: LOS: 2 days      Today?s Date: 07/07/2023    Per emr, [name] is a 57 y.o. M with a history of A fib, DMII, HTN, OSA who was admitted directly for A fib w/RVR (noted on Zio patch despite compliance with Tikosyn).     Source of Information: pt and emr       Plan  Plan: Case Management Assessment, Assist PRN with SW/NCM Services    Spoke to patient and explained role in r/t d/c planning and provided contact information.  Reviewed [facility], Preparing for Discharge, and Preferred Provider Network hand-outs. Provided opportunity for questions and discussion. Encouraged to contact Case Management team with questions and concerns during hospitalization if any assist is needed.    Pt lives in [place] with his wife in a single story home. Pt denies need for assistive device for ambulation. Ncm updated current employer since epic had his old one listed. Pt will not need a return to work letter he states.   Pt is not sure who will provide transportation home. His wife does not like to drive in [facility] and he had a friend bring him to the hospital. Pt states he will need to work on transport options once he knows what day he will d/c.   Pt with Comptroller. No reported concerns for drug coverage.   Plan: Ablation possibly today. If this is done today then possible d/c home as early as Friday pending cardiology recommendations.   Ncm will remain available to assist with any d/c needs if indicated.     Patient Address/Phone  [address]  [place] [address]  [phone] (home)     Emergency Contact  Extended Emergency Contact Information  Primary Emergency Contact: MATTHIAS,SHELA  Address: [address]           [place], [place] [address]  Home Phone: [phone]  Mobile Phone: [phone]  Relation: Spouse    Healthcare Directive  Healthcare Directive: No, patient does not have a healthcare directive  Would patient like to fill out a (a new) Healthcare Directive?: No, patient declined  Psych Advance Directive (Psych unit only): No, patient does not have a Social research officer, government  Does the Patient Need Case Management to Arrange Discharge Transport? (ex: facility, ambulance, wheelchair/stretcher, Medicaid, cab, other): No  Will the Patient Use Family Transport?: Yes    Expected Discharge Date  07/08/2023     Living Situation Prior to Admission  Living Arrangements  Type of Residence: Home, independent  Living Arrangements: Spouse/significant other  Financial risk analyst / Tub: Tub/Shower Unit  How many levels in the residence?: 1  Can patient live on one level if needed?: Yes  Does residence have entry and/or inside stairs?: Yes (4-5 STE)  Assistance needed prior to admit or anticipated on discharge: No  Who provides assistance or could if needed?: wife  Are they in good health?: Yes  Can support system provide 24/7 care if needed?: Maybe  Level of Function   Prior level of function: Independent  Cognitive Abilities   Cognitive Abilities: Alert and Oriented, Continue to Assess, Participates in decision making    Financial Resources  Coverage  Primary Insurance: Nurse, learning disability  Secondary Insurance: No insurance  Additional Coverage: None  Medication Coverage    Medication Coverage: Commercial insurance  Have you experienced a noticeable increase in your copay costs recently?: No  Are current medications affordable?: Yes  Do You Use a Co-Pay Card or a Medication Assistance Program to Help Manage Medication Costs?: No  Do You Manage Your Own Medications?: Yes  Source of Income   Source Of Income: Employed  Financial Assistance Needed?  No reported concerns for medication coverage through his commercial insurance plan    Psychosocial Needs  Mental Health  Mental Health History: No  Substance Use History  Substance Use History Screen: No  Other  na    Current/Previous Services  PCP  Sherene Sires Graball, [phone], [phone]  Pharmacy    [facility] [address] [place] [address] [place] [address] [place] [address]  [place] [address]  Phone: [phone] Fax: [phone]    Durable Medical Equipment    CPAP  Home Health  Receiving home health: No  Hemodialysis or Peritoneal Dialysis  Undergoing hemodialysis or peritoneal dialysis: No  Tube/Enteral Feeds  Receive tube/enteral feeds: No  Infusion  Receive infusions: No  Private Duty  Private duty help used: No  Home and Community Based Services  Home and community based services: No  Ryan White  Ryan White: N/A  Hospice  Hospice: No  Outpatient Therapy  PT: No  OT: No  SLP: No  Skilled Nursing Facility/Nursing Home  SNF: No  NH: No  Inpatient Rehab  IPR: No  Long-Term Acute Care Hospital  LTACH: No  Acute Hospital Stay  Acute Hospital Stay: In the past  Was patient's stay within the last 30 days?: No      Gearlean Alf Integrated Nurse Case Manager Bsn Rn-ACM  Ph [phone]  Available on Voalte

## 2023-07-07 NOTE — Progress Notes
Internal Medicine Progress Note    Name:  Jeremy Bean   XBJYN'[id number] Date:  07/07/2023  Admission Date: 07/05/2023  LOS: 2 days                     Assessment/Plan:    Principal Problem:    Atrial fibrillation with rapid ventricular response (HCC)    Jeremy Bean is a 57 y.o. M with a history of A fib, DMII, HTN, OSA who was admitted directly for A fib w/RVR (noted on Zio patch despite compliance with Tikosyn).     A fib w/RVR  - Zio patch showing continuous A-fib ranging from 70s-200s bpm  - Patient asymptomatic, normally can tell when he is in A-fib  - EKG on admit confirmed A fib w/RVR  - Underwent cardioversion 12/5 (shortly after patient was back in A fib)  - TTE 07/07/23: EF 45% with global hypokinesis, moderately dilated RV with normal function, mildly dilated aorta, mild MR/TR  - CT pulmonary vein protocol: Pulmonary venous anatomy within the spectrum of normal, with early branching of the right superior and inferior first order pulmonary veins. Moderately dilated left atrium. No left atrial appendage thrombus. 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 Mild coronary artery atherosclerotic calcifications. Diffuse hepatic steatosis.   - Continue PTA Tikosyn, eliquis, metoprolol  - Cardiology consulted. NPO for repeat atrial fibrillation/atrial flutter ablation 12/5   - Monitor on telemetry  - Keep K>4, Mg >2    AKI - resolved  - Cr 1.48 at time of presentation (baseline ~1.1)  - PTA lisinopril held  - Cr back to baseline 12/4  - Avoid nephrotoxins  - CTM     HTN  - Continue PTA metoprolol XL 100mg  daily. Held PTA lisinopril 10mg  daily on admit due to AKI     MDD  - Continue PTA lexapro 15mg  daily     DMII  - Recently started on Mounjaro   - Hold PTA metformin, pioglitazone, farxiga  - LDCF     OSA  - Continue CPAP    Class III obesity  - BMI 43  - Recently started on Mounjaro, hold while inpatient    FEN: No IVF, replace lytes PRN, cardiac diet - NPO for repeat ablation   DVT ppx: eliquis  Code status: Full code    Dispo: Continue inpatient care    Total Time Today was 50 minutes in the following activities: Preparing to see the patient, Obtaining and/or reviewing separately obtained history, Performing a medically appropriate examination and/or evaluation, Counseling and educating the patient/family/caregiver, Ordering medications, tests, or procedures, Referring and communication with other health care professionals (when not separately reported), Documenting clinical information in the electronic or other health record, Independently interpreting results (not separately reported) and communicating results to the patient/family/caregiver, and Care coordination (not separately reported)   ________________________________________________________________________    Subjective    No acute events overnight. Patient did not have any symptoms related to recurrence of A fib. Understands plan for possible ablation today.    Medications  Scheduled Meds:apixaban (ELIQUIS) tablet 5 mg, 5 mg, Oral, BID  [Held by Provider] dapagliflozin propanediol (FARXIGA) tablet 10 mg, 10 mg, Oral, QDAY  dofetilide (TIKOSYN) capsule 500 mcg, 500 mcg, Oral, BID  escitalopram oxalate (LEXAPRO) tablet 15 mg, 15 mg, Oral, QDAY  insulin aspart (U-100) (NOVOLOG FLEXPEN U-100 INSULIN) injection PEN 0-6 Units, 0-6 Units, Subcutaneous, ACHS (22)  [Held by Provider] lisinopriL (ZESTRIL)  tablet 10 mg, 10 mg, Oral, QDAY  magnesium oxide (MAGOX) tablet 400 mg, 400 mg, Oral, BID  magnesium oxide (MAGOX) tablet 400 mg, 400 mg, Oral, ONCE  metoprolol succinate XL (TOPROL XL) tablet 100 mg, 100 mg, Oral, QDAY  sodium chloride PF 0.9% injection 10 mL, 10 mL, Intravenous, ONCE    Continuous Infusions:  PRN and Respiratory Meds:dextrose 50% (D50) IV PRN, fentaNYL citrate PF Q5 MIN PRN, fentaNYL citrate PF Q5 MIN PRN, fentaNYL citrate PF Q5 MIN PRN, melatonin QHS PRN, polyethylene glycol 3350 QDAY PRN, sennosides-docusate sodium QDAY PRN, trimethobenzamide Q6H PRN      Review of Systems:  All other systems reviewed and are negative.    Objective:                          Vital Signs: Last Filed                 Vital Signs: 24 Hour Range   BP: 146/89 (12/05 0347)  Temp: 36.2 ?C (97.1 ?F) (12/05 4034)  Pulse: 70 (12/05 0347)  Respirations: 19 PER MINUTE (12/05 0347)  SpO2: 98 % (12/05 0347)  O2 Device: None (Room air) (12/05 0347)  O2 Liter Flow: 6 Lpm (12/04 1637) BP: (112-146)/(66-95)   Temp:  [36.1 ?C (97 ?F)-36.6 ?C (97.9 ?F)]   Pulse:  [70-92]   Respirations:  [13 PER MINUTE-24 PER MINUTE]   SpO2:  [97 %-99 %]   O2 Device: None (Room air)  O2 Liter Flow: 6 Lpm     Vitals:    07/05/23 2017 07/06/23 0500   Weight: (!) 148.3 kg (327 lb) (!) 148.3 kg (327 lb)       Intake/Output Summary:  (Last 24 hours)    Intake/Output Summary (Last 24 hours) at 07/07/2023 0753  Last data filed at 07/07/2023 0631  Gross per 24 hour   Intake 600 ml   Output 2525 ml   Net -1925 ml           Physical Exam  General appearance: Alert, cooperative and no distress  Head: NC/AT  Eyes: Conjunctivae/corneas clear  Neck: No JVD  Lungs: CTAB  Heart: Irregularly irregular  Abdomen: Soft, non-tender. Bowel sounds normal.   Extremities: No edema BLE  Neurologic: No focal deficit      Lab Review  24-hour labs:    Results for orders placed or performed during the hospital encounter of 07/05/23 (from the past 24 hours)   POC GLUCOSE    Collection Time: 07/06/23  8:24 AM   Result Value Ref Range    Glucose, POC 108 (H) 70 - 100 mg/dL   POC GLUCOSE    Collection Time: 07/06/23 12:02 PM   Result Value Ref Range    Glucose, POC 109 (H) 70 - 100 mg/dL   POC GLUCOSE    Collection Time: 07/06/23  3:21 PM   Result Value Ref Range    Glucose, POC 88 70 - 100 mg/dL   POC GLUCOSE    Collection Time: 07/06/23  4:42 PM   Result Value Ref Range    Glucose, POC 84 70 - 100 mg/dL   POC GLUCOSE    Collection Time: 07/06/23  8:47 PM Result Value Ref Range    Glucose, POC 90 70 - 100 mg/dL   BASIC METABOLIC PANEL    Collection Time: 07/07/23  3:39 AM   Result Value Ref Range    Sodium 135 (L) 137 - 147  mmol/L    Potassium 4.0 3.5 - 5.1 mmol/L    Chloride 104 98 - 110 mmol/L    Glucose 97 70 - 100 mg/dL    Blood Urea Nitrogen 25 7 - 25 mg/dL    Creatinine 3.55 7.32 - 1.24 mg/dL    Calcium 9.0 8.5 - 20.2 mg/dL    CO2 21 21 - 30 mmol/L    Anion Gap 10 3 - 12    Glomerular Filtration Rate (GFR) >60 >60 mL/min   MAGNESIUM    Collection Time: 07/07/23  3:39 AM   Result Value Ref Range    Magnesium 1.9 1.6 - 2.6 mg/dL   CBC AND DIFF    Collection Time: 07/07/23  3:40 AM   Result Value Ref Range    White Blood Cells 8.60 4.50 - 11.00 10*3/uL    Red Blood Cells 5.12 4.40 - 5.50 10*6/uL    Hemoglobin 15.1 13.5 - 16.5 g/dL    Hematocrit 54.2 70.6 - 50.0 %    MCV 88.2 80.0 - 100.0 fL    MCH 29.5 26.0 - 34.0 pg    MCHC 33.5 32.0 - 36.0 g/dL    RDW 23.7 62.8 - 31.5 %    Platelet Count 231 150 - 400 10*3/uL    MPV 7.7 7.0 - 11.0 fL    Neutrophils 60 41 - 77 %    Lymphocytes 27 24 - 44 %    Monocytes 11 4 - 12 %    Eosinophils 2 0 - 5 %    Basophils 1 0 - 2 %    Absolute Neutrophil Count 5.10 1.80 - 7.00 10*3/uL    Absolute Lymph Count 2.30 1.00 - 4.80 10*3/uL    Absolute Monocyte Count 0.90 (H) 0.00 - 0.80 10*3/uL    Absolute Eosinophil Count 0.20 0.00 - 0.45 10*3/uL    Absolute Basophil Count 0.10 0.00 - 0.20 10*3/uL   POC GLUCOSE    Collection Time: 07/07/23  7:48 AM   Result Value Ref Range    Glucose, POC 108 (H) 70 - 100 mg/dL       Point of Care Testing  (Last 24 hours)  Glucose: 97 (07/07/23 0339)  POC Glucose (Download): (!) 108 (07/07/23 1761)    Radiology and other Diagnostics Review:    Pertinent radiology reviewed.    [facility], DO   Pager

## 2023-07-07 NOTE — Progress Notes
Pt's wedding ring, wallet and dentures locked in HC516 closet. Primary RN notified.

## 2023-07-07 NOTE — Anesthesia Pain Rounding
Anesthesia Follow-Up Evaluation: Post-Procedure Day One    Name: Jeremy Bean     MRN: [id number]     DOB: 01/09/1966     Age: 57 y.o.     Sex: male   __________________________________________________________________________     Procedure Date: 07/06/2023   Procedure: Procedure(s):  EXTERNAL CARDIOVERSION    Physical Assessment  Height: 185.4 cm (6' 1)  Weight: (!) 148.3 kg (327 lb)    Vital Signs (Last Filed in 24 hours)  BP: 146/89 (12/05 0347)  Temp: 36.2 ?C (97.1 ?F) (12/05 4540)  Pulse: 70 (12/05 0347)  Respirations: 19 PER MINUTE (12/05 0347)  SpO2: 98 % (12/05 0347)  O2 Device: None (Room air) (12/05 0347)  O2 Liter Flow: 6 Lpm (12/04 1637)    Patient History   Allergies  Allergies   Allergen Reactions    Glipizide MUSCLE PAIN        Medications  Scheduled Meds:apixaban (ELIQUIS) tablet 5 mg, 5 mg, Oral, BID  [Held by Provider] dapagliflozin propanediol (FARXIGA) tablet 10 mg, 10 mg, Oral, QDAY  dofetilide (TIKOSYN) capsule 500 mcg, 500 mcg, Oral, BID  escitalopram oxalate (LEXAPRO) tablet 15 mg, 15 mg, Oral, QDAY  insulin aspart (U-100) (NOVOLOG FLEXPEN U-100 INSULIN) injection PEN 0-6 Units, 0-6 Units, Subcutaneous, ACHS (22)  [Held by Provider] lisinopriL (ZESTRIL) tablet 10 mg, 10 mg, Oral, QDAY  magnesium oxide (MAGOX) tablet 400 mg, 400 mg, Oral, BID  magnesium oxide (MAGOX) tablet 400 mg, 400 mg, Oral, ONCE  metoprolol succinate XL (TOPROL XL) tablet 100 mg, 100 mg, Oral, QDAY  sodium chloride PF 0.9% injection 10 mL, 10 mL, Intravenous, ONCE    Continuous Infusions:  PRN and Respiratory Meds:dextrose 50% (D50) IV PRN, fentaNYL citrate PF Q5 MIN PRN, fentaNYL citrate PF Q5 MIN PRN, fentaNYL citrate PF Q5 MIN PRN, melatonin QHS PRN, polyethylene glycol 3350 QDAY PRN, sennosides-docusate sodium QDAY PRN, trimethobenzamide Q6H PRN      Diagnostic Tests  Hematology:   Lab Results   Component Value Date    HGB 15.1 07/07/2023    HGB 13.7 12/23/2022    HCT 45.1 07/07/2023    HCT 40.7 12/23/2022    PLTCT 231 07/07/2023    PLTCT 224 12/23/2022    WBC 8.60 07/07/2023    WBC 6.9 12/23/2022    NEUT 60 07/07/2023    NEUT 54 08/04/2018    ANC 5.10 07/07/2023    ANC 4.60 08/04/2018    ALC 2.30 07/07/2023    ALC 2.60 08/04/2018    MONA 11 07/07/2023    MONA 12 08/04/2018    AMC 0.90 07/07/2023    AMC 1.00 08/04/2018    EOSA 2 07/07/2023    EOSA 2 08/04/2018    ABC 0.10 07/07/2023    ABC 0.10 08/04/2018    MCV 88.2 07/07/2023    MCV 88.8 12/23/2022    MCH 29.5 07/07/2023    MCH 29.9 12/23/2022    MCHC 33.5 07/07/2023    MCHC 33.7 12/23/2022    MPV 7.7 07/07/2023    MPV 7.5 12/23/2022    RDW 14.3 07/07/2023    RDW 13.9 12/23/2022         General Chemistry:   Lab Results   Component Value Date    NA 135 07/07/2023    NA 134 12/23/2022    K 4.0 07/07/2023    K 4.5 12/23/2022    CL 104 07/07/2023    CL 103 12/23/2022    CO2 21 07/07/2023  CO2 21 12/23/2022    GAP 10 07/07/2023    GAP 10 12/23/2022    BUN 25 07/07/2023    BUN 18 12/23/2022    CR 0.95 07/07/2023    CR 0.90 12/23/2022    GLU 97 07/07/2023    GLU 108 12/23/2022    CA 9.0 07/07/2023    CA 9.1 12/23/2022    ALBUMIN 4.1 12/21/2022    LACTIC 1.0 07/27/2018    OBSCA 1.16 12/22/2022    MG 1.9 07/07/2023    MG 2.3 12/23/2022    TOTBILI 0.4 12/21/2022    PO4 3.2 12/21/2022      Coagulation:   Lab Results   Component Value Date    INR 1.1 08/03/2018         Follow-Up Assessment  Patient location during evaluation: floor      Anesthetic Complications:   Anesthetic complications: The patient did not experience any anesthestic complications.      Pain:    Management:adequate     Level of Consciousness: awake and alert   Hydration:acceptable     Airway Patency: patent   Respiratory Status: acceptable     Cardiovascular Status:acceptable   Regional/Neuroaxial:

## 2023-07-07 NOTE — Progress Notes
HC5 END OF SHIFT/PLAN OF CARE NURSING NOTE   Nursing Shift: Day Shift 0700-1900    Acute events, nursing interventions, & communication with providers: patient went for CT and echo this morning. In afternoon he went for ablation today. Bilateral groin punctures hemostasis at 1645. Patient in SR. Bed rest till 2036. Foley can be removed after bed rest. Patient complaining of constipation. PRN senna administered.        Patient Goal(s)  Patient will  have a successful ablation  by the end of next shift.        Patient will   remain in Sinus rhythm  by discharge.   Admission Weight: Weight: (!) 148.3 kg (327 lb)    Last 3 Weights:   Vitals:    07/05/23 2017 07/06/23 0500 07/07/23 0815   Weight: (!) 148.3 kg (327 lb) (!) 148.3 kg (327 lb) (!) 148.3 kg (327 lb)     Weight Change: Weight trend stable    Intake/Output Summary (Last 24 hours) at 07/07/2023 1838  Last data filed at 07/07/2023 1610  Gross per 24 hour   Intake 1420 ml   Output 2185 ml   Net -765 ml     Last Bowel Movement Date:  (PTA)    Fluid Restriction? No   Quality/Safety    Total Fall Risk Score: 7   Risk for Injury related to falls: Surgery/procedure within the last 24 hours  Fall Risk Category:   History of More Than One Fall Within 6 Months Before Admission: No  Elimination, Bowel and Urine: N/A  Interventions: Place male/male urinal within reach   Medications: Sedated procedure within past 24 hours  Interventions: Stay within arm's reach during toileting/showering (i.e., dizziness, orthostasis)   Patient Care Equipment: N/A  Interventions: Needs assistance with patient care equipment when ambulating  Mobility: 0 - No mobility issues  Interventions: Assist x1  Cognition: 0 - No cognition issues  Interventions: N/A - Does not score as risk in this category    Other safety precautions in place: N/A    Restraints:  No      Patient Education  This RN provided education to Patient today. The following education topics were reviewed:  Quality/Safety Education:   Fall risk  Medication Education:   Medication management (Indication, adverse effects, monitoring, etc)  Education provided on the following medication(s): Tikosyn   Cardiac - Specific Education:   Cardiac diet/nutrition  General Education:   Tests/Procedures: ablation     The following teaching method(s) were used: Verbal  Response to learning: [employer] Understanding  Needs reinforcement on: n/a

## 2023-07-08 DIAGNOSIS — Z9049 Acquired absence of other specified parts of digestive tract: Secondary | ICD-10-CM

## 2023-07-08 DIAGNOSIS — Z6841 Body Mass Index (BMI) 40.0 and over, adult: Secondary | ICD-10-CM

## 2023-07-08 DIAGNOSIS — Z7901 Long term (current) use of anticoagulants: Secondary | ICD-10-CM

## 2023-07-08 DIAGNOSIS — Z7985 Long-term (current) use of injectable non-insulin antidiabetic drugs: Secondary | ICD-10-CM

## 2023-07-08 DIAGNOSIS — I4819 Other persistent atrial fibrillation: Secondary | ICD-10-CM

## 2023-07-08 DIAGNOSIS — Z79899 Other long term (current) drug therapy: Secondary | ICD-10-CM

## 2023-07-08 DIAGNOSIS — Z87442 Personal history of urinary calculi: Secondary | ICD-10-CM

## 2023-07-08 DIAGNOSIS — E119 Type 2 diabetes mellitus without complications: Secondary | ICD-10-CM

## 2023-07-08 DIAGNOSIS — N179 Acute kidney failure, unspecified: Secondary | ICD-10-CM

## 2023-07-08 DIAGNOSIS — E66813 Obesity, class 3: Secondary | ICD-10-CM

## 2023-07-08 DIAGNOSIS — F329 Major depressive disorder, single episode, unspecified: Secondary | ICD-10-CM

## 2023-07-08 DIAGNOSIS — G4733 Obstructive sleep apnea (adult) (pediatric): Secondary | ICD-10-CM

## 2023-07-08 DIAGNOSIS — K760 Fatty (change of) liver, not elsewhere classified: Secondary | ICD-10-CM

## 2023-07-08 LAB — ECG 12-LEAD
P AXIS: 36 degrees
P-R INTERVAL: 184 ms
Q-T INTERVAL: 400 ms
QRS DURATION: 90 ms
QTC CALCULATION (BAZETT): 503 ms
R AXIS: 80 degrees
T AXIS: 63 degrees
VENTRICULAR RATE: 95 {beats}/min

## 2023-07-08 LAB — POC GLUCOSE
~~LOC~~ BKR POC GLUCOSE: 111 mg/dL — ABNORMAL HIGH (ref 70–100)
~~LOC~~ BKR POC GLUCOSE: 138 mg/dL — ABNORMAL HIGH (ref 70–100)
~~LOC~~ BKR POC GLUCOSE: 215 mg/dL — ABNORMAL HIGH (ref 70–100)

## 2023-07-08 NOTE — Discharge Instructions - Pharmacy
Discharge Summary      Name: Jeremy Bean  Medical Record Number: [id number]        Account Number:  [id number]  Date Of Birth:  01/24/1966                         Age:  57 y.o.  Admit date:  07/05/2023                     Discharge date: 07/08/2023      Discharge Attending:  Belenda Cruise Mirta Mally  Discharge Summary Completed By: Baird Cancer, DO    Service: Med Private L- [phone]    Reason for hospitalization:  Atrial fibrillation with rapid ventricular response [facility]) [I48.91]    Primary Discharge Diagnosis:   Same as Above    Hospital Diagnoses:  Hospital Problems        Active Problems    * (Principal) Atrial fibrillation with rapid ventricular response (HCC)     Present on Admission:   Atrial fibrillation with rapid ventricular response (HCC)        Significant Past Medical History        Atrial fibrillation (HCC)  Cataract  Diabetes (HCC)  Enlarged LA (left atrium)  History of hernia repair  History of renal calculi      Comment:  Bilateral  History of rotator cuff surgery  HTN (hypertension)  HX: anticoagulation  Obesity  Obstructive sleep apnea    Allergies   Glipizide    Brief Hospital Course   The patient was admitted and the following issues were addressed during this hospitalization: (with pertinent details including admission exam/imaging/labs).      Jeremy Bean is a 57 y.o. M with a history of A fib, DMII, HTN, OSA who was admitted directly for A fib w/RVR (noted on Zio patch despite compliance with Tikosyn). EP consulted. EKG on admit confirmed A fib w/RVR. Underwent cardioversion 12/4 however shortly after procedure patient was back in A fib. Patient underwent successful ablation on 07/07/23. TTE obtained 07/07/23 which showed global LV hypokinesis, EF 45%. Patient was continued on Tikosyn, metoprolol, lisinopril, farxiga. Outpatient cardiology follow up was requested by EP.     Items Needing Follow Up   Pending items or areas that need to be addressed at follow up: None    Pending Labs and Follow Up Radiology    Pending labs and/or radiology review at this time of discharge are listed below: Please note- any labs with collected status will not have a result; if this area is blank, there are no items for review.   Pending Labs       Order Current Status    POC GLUCOSE In process    POC GLUCOSE In process              Medications      Medication List      CONTINUE taking these medications     atorvastatin 20 mg tablet; Commonly known as: LIPITOR; Dose: 20 mg;   Refills: 0   CHOLEcalciferoL (vitamin D3) 1,000 units tablet; Dose: 1,000 Units;   Refills: 0   dofetilide 500 mcg capsule; Commonly known as: TIKOSYN; Dose: 500 mcg;   Take one capsule by mouth twice daily. Indications: atrial fibrillation   electrically shocked to normal rhythm; For: atrial fibrillation   electrically shocked to normal rhythm; Quantity: 180 capsule; Refills: 1   ELIQUIS 5 mg tablet; Generic drug: apixaban;  Dose: 5 mg; Take one tablet   by mouth twice daily.; Quantity: 60 tablet; Refills: 1   escitalopram oxalate 10 mg tablet; Commonly known as: LEXAPRO; Dose: 15   mg; Refills: 0   FARXIGA 10 mg tablet; Generic drug: dapagliflozin propanediol; Dose: 10   mg; Refills: 0   lisinopriL 10 mg tablet; Commonly known as: ZESTRIL; Take 1 tablet by   mouth once daily; Quantity: 90 tablet; Refills: 3   magnesium oxide 400 mg (241.3 mg magnesium) tablet; Commonly known as:   MAGOX; Dose: 400 mg; Take one tablet by mouth twice daily. Indications:   Magnesium supplementation; For: Magnesium supplementation; Quantity: 180   tablet; Refills: 1   metFORMIN 1,000 mg tablet; Commonly known as: GLUCOPHAGE; Dose: 1,000   mg; Refills: 0   metoprolol succinate XL 50 mg extended release tablet; Commonly known   as: TOPROL XL; Dose: 100 mg; Take two tablets by mouth daily. Indications:   ventricular rate control in atrial fibrillation; For: ventricular rate   control in atrial fibrillation; Quantity: 180 tablet; Refills: 1   OZEMPIC 1 mg/dose (4 mg/3 mL) injection PEN; Generic drug: semaglutide;   Dose: 1 mg; Refills: 0   pioglitazone 30 mg tablet; Commonly known as: ACTOS; Dose: 30 mg;   Refills: 0       Return Appointments and Scheduled Appointments     Scheduled appointments:      Sep 21, 2023 3:00 PM  Office visit with [name], APRN-NP  Cardiovascular Medicine: [facility] (CVM Exam) [phone] [address].  [address] [place] [address]  [phone]     Oct 12, 2023 1:00 PM  Office visit with Jen Mow, MD  Cardiovascular Medicine: [facility] (CVM Exam) [phone] [address].  [address] [place] [address]  [phone]            Consults, Procedures, Diagnostics, Micro, Pathology   Consults: Cardiology - EP  Surgical Procedures & Dates: None  Significant Diagnostic Studies, Micro and Procedures: noted in brief hospital course  Significant Pathology: none                                   Discharge Disposition, Condition   Patient Disposition: Home  Condition at Discharge: Stable    Code Status   Full Code    Patient Instructions     Activity       Activity as Tolerated   As directed      It is important to keep increasing your activity level after you leave the hospital.  Moving around can help prevent blood clots, lung infection (pneumonia) and other problems.  Gradually increasing the number of times you are up moving around will help you return to your normal activity level more quickly.  Continue to increase the number of times you are up to the chair and walking daily to return to your normal activity level. Begin to work toward your normal activity level at discharge    POST PROCEDURE ACTIVITY   As directed      You should resume your normal activity in 3 days.  You may drive after 2 days.  No lifting greater than 10 pounds for 1 week.  No sexual activity of strenuous activity for 1 week.  You may shower now, however no baths for 1 week.          Diet       Cardiac Diet  As directed      Limiting unhealthy fats and cholesterol is the most important step you can take in reducing your risk for cardiovascular disease.  Unhealthy fats include saturated and trans fats.  Monitor your sodium and cholesterol intake.  Restrict your sodium to 2g (grams) or 2000mg  (milligrams) daily, and your cholesterol to 200mg  daily.    If you have questions regarding your diet at home, you may contact a dietitian at [phone].          Wounds/Lines/Drains       DISCHARGE WOUND CARE   As directed      Call if any swelling, redness or bleeding from groin site.  No soaking in bath tub for 1 week.             Discharge education provided to patient., Signs and Symptoms:   Report these signs and symptoms       Report These Signs and Symptoms   As directed      Please contact your doctor if you have any of the following symptoms: temperature higher than 100.4 degrees F, uncontrolled pain, persistent nausea and/or vomiting, difficulty breathing, chest pain, severe abdominal pain, headache, unable to urinate, or unable to have bowel movement        , Education: , and Others Instructions:   Other Orders       ECG 12-LEAD   Complete by: As directed      Release to patient: Immediate    Questions About Your Stay   Complete by: As directed      Discharging attending physician: Baird Cancer    Order comments: If you have an emergency after discharge, please dial [phone].    You may contact your discharging physician up to 7 days after discharge for questions about your hospitalization, discharge instructions, or medications by calling [phone] during regular business hours (8AM-4PM) and asking to speak to the doctor listed on the discharge information.       If you are not calling during business hours, ask for the on-call doctor.    If you have new  or worsening symptoms, you may be directed to your primary care provider (PCP) for ongoing questions, an Emergency Department, or an Urgent Care Clinic for a more immediate evaluation.    For all calls or questions more than 7 days after discharge, please contact your primary care provider (PCP).    For medications after discharge: pain (opioid) medicine cannot be refilled or prescribed by calling your discharging physician.  These medications need to be filled by your primary care provider (PCP).  Regular refill requests should be directed to your primary care provider (PCP).   Additional instructions:    Lourena Simmonds mobile is the heart monitor device we discussed with you. You can purchase this on amazon or through the [employer] mobile website: SleepsAround.co.za. I have added more information below on how it works.                                  Additional Orders: Case Management, Supplies, Home Health     Home Health/DME       None              Signed:  Baird Cancer, DO  07/08/2023      cc:  Primary Care Physician:  Sherene Sires Fnp-C   Verified    Referring physicians:  No Pcp,  Na   Additional provider(s):        Did we miss something? If additional records are needed, please fax a request on office letterhead to [phone]. Please include the patient's name, date of birth, fax number and type of information needed. Additional request can be made by email at [email or web address]. For general questions of information about electronic records sharing, call [phone].

## 2023-07-08 NOTE — Progress Notes
RT Adult Assessment Note    NAME:Jeremy Bean             MRN: [id number]             DOB:Dec 13, 1965          AGE: 57 y.o.  ADMISSION DATE: 07/05/2023             DAYS ADMITTED: LOS: 3 days    Additional Comments:  Impressions of the patient: pt sitting in chair on RA no respiratory distress noted  Intervention(s)/outcome(s): re assessed pt  Patient education that was completed: none  Recommendations to the care team: continue with care plan per rt protocol     Vital Signs:  Pulse: 84  RR: 18 PER MINUTE  SpO2: 95 %  O2 Device: None (Room air)  Liter Flow:    O2%:      Breath Sounds:   Breath Sounds WDL: Within Defined Limits  Right Apex Breath Sounds: Clear (Implies normal)  Right Base Breath Sounds: Decreased  Left Apex Breath Sounds: Clear (Implies normal)  Left Base Breath Sounds: Decreased  Respiratory Effort:   Respiratory WDL: Within Defined Limits  Comments:

## 2023-07-08 NOTE — Progress Notes
Name: Jeremy Bean.        MRN: [id number]          DOB: [birth year]            Age: 57 y.o.  Admission Date: 07/05/2023       LOS: 3 days    Date of Service: 07/08/2023    Day of Discharge Note    Day of discharge progress note for Jeremy Bean    Chart data reviewed including medications, consultation notes, lab, vitals, imaging.  Patient was seen and examined with pertinent information listed below.    Subjective: No acute events overnight. Patient remains in sinus rhythm. He was feeling well and comfortable going home today pending final cardiology recommendations.     Exam: NAD. Heart RRR. Lungs CTAB    Discharge plans and pertinent follow up items after discharge:     Principal Problem:    Atrial fibrillation with rapid ventricular response (HCC)    S/p successful ablation 12/5, remains in NSR. Discharge pending final EP recs.    Patient feels comfortable with plans for discharge.  All questions were answered.  Discharge discussion, including follow up/discharge instructions, occurred with patient face-to-face.      Jeremy Grammes Indianna Boran, DO  07/08/2023    Discharge Planning: greater than 30 minutes spent in counseling pt, coordinating discharge care, placing discharge orders and complete discharge summary.

## 2023-07-08 NOTE — Case Management (ED)
Case Management Progress Note    NAME:Jeremy Bean.                          MRN: [id number]              DOB:Jan 19, 1966          AGE: 57 y.o.  ADMISSION DATE: 07/05/2023             DAYS ADMITTED: LOS: 3 days      Today's Date: 07/08/2023    PLAN: pending d/c home today    Expected Discharge Date: 07/08/2023   Is Patient Medically Stable: Yes   Are there Barriers to Discharge? No    Pt anticipated to be stable for d/c home today  No current case management needs identified for d/c.   Pt anticipated to have his friend from [place] provide transportation home.     INTERVENTION/DISPOSITION:  Discharge Planning              Discharge Planning: No Needs Identified  Transportation              Does the Patient Need Case Management to Arrange Discharge Transport? (ex: facility, ambulance, wheelchair/stretcher, Medicaid, cab, other): No  Will the Patient Use Family Transport?: Yes  Support              Support: Huddle/team update  Info or Referral              Information or Referral to Community Resources: No Needs Identified  Positive SDOH Domains and Potential Barriers                   Medication Needs              Medication Needs: No Needs Identified                               Financial              Financial: No Needs Identified  Legal              Legal: No Needs Identified  Other              Other/None: No needs identified  Discharge Disposition                                                                                   Selected Continued Care - Admitted Since 07/05/2023    No services have been selected for the patient.       Gearlean Alf Integrated Nurse Case Manager Bsn Rn-ACM  Ph [phone]  Available on [facility]

## 2023-07-08 NOTE — Progress Notes
HC5 END OF SHIFT/PLAN OF CARE NURSING NOTE   Nursing Shift: Day Shift 0700-1900    Acute events, nursing interventions, & communication with providers: No acute events. Pt may discharge today. Pt has ride if able to dc today. Bilateral groin sites assessed with no hematoma or pain.       Patient Goal(s)  Patient will Achieve timely healing; be free of purulent secretions, drainage, or erythema; and be afebrile by the end of next shift.        Patient will  Verbalize readiness for discharge by discharge.   Admission Weight: Weight: (!) 148.3 kg (327 lb)    Last 3 Weights:   Vitals:    07/05/23 2017 07/06/23 0500 07/07/23 0815   Weight: (!) 148.3 kg (327 lb) (!) 148.3 kg (327 lb) (!) 148.3 kg (327 lb)     Weight Change: Weight trend stable    Intake/Output Summary (Last 24 hours) at 07/08/2023 1541  Last data filed at 07/08/2023 1200  Gross per 24 hour   Intake 2070 ml   Output 2300 ml   Net -230 ml     Last Bowel Movement Date: 07/06/23    Fluid Restriction? No   Quality/Safety    Total Fall Risk Score: 7   Risk for Injury related to falls: Standard risk for injury based on the ABCS scoring tool  Fall Risk Category:   History of More Than One Fall Within 6 Months Before Admission: No  Elimination, Bowel and Urine: N/A  Interventions: N/A - Does not score for risk in this category  Medications: Sedated procedure within past 24 hours  Interventions: Stay within arm's reach during toileting/showering (i.e., dizziness, orthostasis)   Patient Care Equipment: N/A  Interventions: N/A - Does not score as risk in this category  Mobility: 0 - No mobility issues  Interventions: N/A - Does not score as risk in this category  Cognition: 0 - No cognition issues  Interventions: N/A - Does not score as risk in this category    Other safety precautions in place: N/A    Restraints:  No      Patient Education  This RN provided education to Patient today. The following education topics were reviewed:  Quality/Safety Education:   Fall risk  Medication Education:   Cardiac medication(s)  Education provided on the following medication(s): Eliquis  Cardiac - Specific Education:   Post cardiac procedure care: Afb ablation  General Education:   Diet/nutrition and Discharge planning    The following teaching method(s) were used: Verbal  Response to learning: [employer] Understanding  Needs reinforcement on: NA

## 2023-07-08 NOTE — Progress Notes
HC5 END OF SHIFT/PLAN OF CARE NURSING NOTE   Nursing Shift: Night Shift 1900-0700    Acute events, nursing interventions, & communication with providers:     Patient off bedrest at 2230.   2315: Foley taken out and patient encouraged to void, but unable to at this time.   0230: Patient able to void.     Patient Goal(s)  Patient will Verbalize readiness for discharge by the end of next shift.        Patient will   remain in Sinus rhythm  by discharge.   Admission Weight: Weight: (!) 148.3 kg (327 lb)    Last 3 Weights:   Vitals:    07/05/23 2017 07/06/23 0500 07/07/23 0815   Weight: (!) 148.3 kg (327 lb) (!) 148.3 kg (327 lb) (!) 148.3 kg (327 lb)     Weight Change: Weight trend stable    Intake/Output Summary (Last 24 hours) at 07/08/2023 0036  Last data filed at 07/07/2023 2300  Gross per 24 hour   Intake 1420 ml   Output 2710 ml   Net -1290 ml     Last Bowel Movement Date: 07/06/23    Fluid Restriction? No   Quality/Safety    Total Fall Risk Score: 11   Risk for Injury related to falls: Surgery/procedure within the last 24 hours  Fall Risk Category:   History of More Than One Fall Within 6 Months Before Admission: No  Elimination, Bowel and Urine: N/A  Interventions: Place male/male urinal within reach  and Elimination schedule   Medications: Sedated procedure within past 24 hours  Interventions: Stay within arm's reach during toileting/showering (i.e., dizziness, orthostasis)  and Educate patient on medication side effects  Patient Care Equipment: Two present  Interventions: Needs assistance with patient care equipment when ambulating  Mobility: 2 - Assistance required  Interventions: Assist x1  Cognition: 0 - No cognition issues  Interventions: N/A - Does not score as risk in this category    Other safety precautions in place: N/A    Restraints:  No      Patient Education  This RN provided education to Patient today. The following education topics were reviewed:  Quality/Safety Education:   Fall risk  Medication Education:   Medication management (Indication, adverse effects, monitoring, etc)  Education provided on the following medication(s): Eliquis   Cardiac - Specific Education:   Cardiac diet/nutrition  General Education:   Tests/Procedures: ablation     The following teaching method(s) were used: Verbal  Response to learning: [employer] Understanding  Needs reinforcement on: n/a

## 2023-07-08 NOTE — Progress Notes
EP Lab next day brief follow-up note    I examined the patient this morning after the procedure performed yesterday.     No significant bleeding from any of the insertion sites at this time.  No significant hematoma or bruit noted.    Figure of 8 hemostatic stitch removed bilaterally.     Vitals stable this morning.      Telemetry review shows no significant atrial / ventricular arrhythmias     Further management and Discharge instructions as per CTR APP / Inpatient EP cardiology team.    Worthy Keeler, DO  Electrophysiology Fellow

## 2023-07-14 ENCOUNTER — Encounter: Admit: 2023-07-14 | Discharge: 2023-07-14 | Payer: BC Managed Care – PPO

## 2023-07-14 NOTE — Telephone Encounter
-----   Message from [place] sent at 07/07/2023  4:51 PM CST -----  Regarding: RCP- 1 week f/u phone call  Please call to complete 1 week phone call. Dr. Bradly Bienenstock did Af ablation inpatient, Dr. Wallene Huh patient.

## 2023-07-14 NOTE — Telephone Encounter
Attempted to contact patient to complete 1 week f/u phone call, but no answer. [facility].

## 2023-07-18 ENCOUNTER — Encounter: Admit: 2023-07-18 | Discharge: 2023-07-18 | Payer: BC Managed Care – PPO

## 2023-08-09 ENCOUNTER — Encounter: Admit: 2023-08-09 | Discharge: 2023-08-09 | Payer: BC Managed Care – PPO

## 2023-08-09 MED ORDER — METOPROLOL SUCCINATE 50 MG PO TB24
100 mg | ORAL_TABLET | Freq: Every day | ORAL | 1 refills | 90.00000 days | Status: DC
Start: 2023-08-09 — End: 2023-08-09

## 2023-08-09 MED ORDER — METOPROLOL SUCCINATE 50 MG PO TB24
100 mg | ORAL_TABLET | Freq: Every day | ORAL | 1 refills | 90.00000 days | Status: AC
Start: 2023-08-09 — End: ?

## 2023-08-09 NOTE — Telephone Encounter
RC to pt. He was frustrated because he has been without metoprolol for a couple weeks. He said that when he was inpt somehow the ordering physician for his metoprolol was switched. Dr. Wallene Huh normally ordered it. He said he was on 100 mg Toprol XL and he is needing it sent to [facility] pharmacy in [place].    Reviewed plan with the patient. Patient verbalized understanding and does not have any further questions or concerns. No further education requested from patient. Patient has our contact information for future needs.

## 2023-08-09 NOTE — Telephone Encounter
-----   Message from [facility] sent at 08/09/2023 10:13 AM CST -----  Regarding: RAD- med rejected  VM on triage line from patient at 9:46am.  Said that [facility] pharmacy in [place] has tried for 2 weeks to get refills on his Metoprolol and you will not fill it.  I'm out now and why will you not fill it?  Call him at #[phone].

## 2023-09-15 ENCOUNTER — Encounter: Admit: 2023-09-15 | Discharge: 2023-09-15 | Payer: BC Managed Care – PPO

## 2023-09-21 ENCOUNTER — Encounter: Admit: 2023-09-21 | Discharge: 2023-09-21 | Payer: BC Managed Care – PPO

## 2023-09-21 ENCOUNTER — Ambulatory Visit: Admit: 2023-09-21 | Discharge: 2023-09-22 | Payer: BC Managed Care – PPO

## 2023-09-21 DIAGNOSIS — I10 Essential (primary) hypertension: Secondary | ICD-10-CM

## 2023-09-21 DIAGNOSIS — I517 Cardiomegaly: Secondary | ICD-10-CM

## 2023-09-21 DIAGNOSIS — I4819 Other persistent atrial fibrillation: Secondary | ICD-10-CM

## 2023-09-21 DIAGNOSIS — R0989 Other specified symptoms and signs involving the circulatory and respiratory systems: Secondary | ICD-10-CM

## 2023-09-21 NOTE — Progress Notes
 Date of Service: 09/21/2023    Jeremy Pellerito. is a 58 y.o. male.       HPI     Jeremy Bean was seen in the office today in electrophysiology follow-up. As you may know, he is a 58 y.o. male, with past medical history including hypertension, obesity, obstructive sleep apnea, and paroxysmal atrial fibrillation status post ablation (10/2007) and residual typical flutter status post ablation (06/21/2009).  He follows with Dr. Wallene Huh.       He underwent PVI in 2009 and typical atrial flutter ablation in 2010.  He did have recurrence of AF with RVR in 2014 and Flecainide was resumed.  In February 2024 in the clinic he was found to be in AF.  Flecainide was increased to 150 mg twice daily.  Cardioversion was planned but when he presented he was found to be in sinus rhythm.  Regadenoson MPI from late March 2024 did not show any evidence of significant myocardial ischemia.  In the clinic in late April 2024 he was again in AF.  He was ultimately admitted in May 2024 and initiated on Tikosyn 500 mcg twice daily.    I met with Jeremy Bean and July 2024.  He was in sinus rhythm per ECG.  Initially he felt better in sinus rhythm but had started to feel some tiredness.  He had been told at a recent office visit that he was in atrial fibrillation.  In November 2024, a cardiac monitor revealed a 100% burden of AF.  After discussion with Dr. Wallene Huh the decision was made for direct admission for further management.    He was admitted from 12/3 -07/08/2023.  He underwent successful cardioversion on 12/4 but had recurrent AF.  Echocardiogram revealed an LVEF of 45%, mildly dilated left atrium, and no significant valvular abnormalities.  On 12/5 he underwent ablation procedure with Dr. Bradly Bienenstock.  This was a Farapulse PFA PVI which included posterior wall ablation.  Roughly 10% atrial scarring was noted.  He was continued on Tikosyn, Toprol-XL, and Eliquis.      Today in clinic reports doing well since his ablation procedure.  He is in sinus rhythm per ECG today.  He has noted improvement in his tiredness.  He is also made dietary improvements cutting out to the vending machine snacks and caffeine.  He is down about 10 pounds from when he was in the hospital.  He tells me his he has had a slight reduction in his A1C.  He also tells me his blood pressure machine has suggested AF a few times since his ablation, but nothing for the last couple of weeks.          Vitals:    09/21/23 1436   BP: 136/78   BP Source: Arm, Left Upper   Pulse: 93   SpO2: 95%   O2 Device: None (Room air)   PainSc: Zero   Weight: (!) 144.3 kg (318 lb 3.2 oz)   Height: 185.4 cm (6' 1)     Body mass index is 41.98 kg/m?Marland Kitchen     Past Medical History  Patient Active Problem List    Diagnosis Date Noted    Atrial fibrillation with rapid ventricular response (HCC) 07/05/2023    Encounter for monitoring dofetilide therapy 12/21/2022    Atrial fibrillation (HCC) 07/26/2018    Abdominal pain 07/26/2018    OSA on CPAP 12/31/2013    Obstructive sleep apnea 06/05/2009     Obstructive sleep apnea status post CPAP  initiation approximately three weeks ago      History of rotator cuff surgery 06/05/2009     Rotator cuff repair three years ago      S/P appendectomy 06/05/2009    History of hernia repair 06/05/2009    Coronary artery disease (CAD) excluded 06/05/2009     0/20/08 cardiac catheterization: Normal coronary arteries and LV function. Echo  10/08 left atrial diameter 4.5 cm. EF 63%, confirmed on TEE in 03/09.  12/06/2012 - Stress Test:  No chest pain with exercise stress.  Poor exercise tolerance for age.  Submaximal ECG exercise stress test per Bruce protocol which was negative for ischemia at the workload achieved.  Occasional atrial and ventricular ectopy; No dysrhythmias noted.  Exercise echocardiogram which was negative for ischemia at workload achieved (However, the sensitivity of test is markedly diminished secondary to low workload and low double product)  12/28/2017 - Regadenoson MPI:  This study is mildly abnormal qualitatively.  There is subtle ischemia perhaps in a diagonal and in the right coronary artery territory.  The lung uptake is in the normal range but the TID ratio is borderline.  From a perfusion standpoint defects are not high risk and quantitative polar map assessment of the defects are not statistically significant.  11/29/2019 - Regadenoson MPI:  This myocardial pressure imaging study is very mildly abnormal but overall not high risk.  There is very small size and mild intensity inferior wall perfusion abnormality that could represent a small ischemic territory towards the LV apex in the RCA distribution.  There are no large areas of jeopardized ischemic myocardium.  Left ventricular cavity was not dilated at rest and did not further delay to stress.  Normal left ventricle systolic function, ejection fraction 66%, there are no regional wall motion abnormalities.  Normal pulmonary to myocardial count ratio and normal left ventricular end-diastolic volume.  Overall, high risk scintigraphic indicators are not present.           Enlarged LA (left atrium) 06/05/2009     Most recent echo today on Dec 04, 2008 shows left atrium actually 3.8 cm and left       ventricular dimension 4.6 cm.      AF (atrial fibrillation) (HCC) 10/24/2007      Paroxysmal atrial fibrillation x 3 years. Became persistent and highly symptomatic status post left atrial ablation for atrial fibrillation 10/24/07 with no re-inducibility in spite of Isuprel and burst pacing up to 200 msec. Patient has isolated arrhythmias coming from the coronary sinus from the deep coronary sinus brought out by high-dose Isuprel.    07/27/2018 - ECHO:  Low normal left ventricle systolic function with estimated LVEF of 50%.  The right ventricle is not visualized well. Limited views suggest normal right ventricular size and contractility.  Mild biatrial dilatation.  Aortic sclerosis without stenosis.  No other significant valvular abnormalities.  No pericardial effusion.  07/31/2018 - TEE / DCCV:  Successful DC cardioversion of Atrial fibrillation to sinus rhythm.   08/03/2018 - DCCV:   Successful DC cardioversion of Atrial fibrillation to sinus rhythm.       Hypertension 10/24/2007     Hypertension x 2 years           Review of Systems   Constitutional: Negative.   HENT: Negative.     Eyes: Negative.    Cardiovascular: Negative.    Respiratory: Negative.     Endocrine: Negative.    Hematologic/Lymphatic: Negative.    Skin: Negative.    Musculoskeletal:  Negative.    Gastrointestinal: Negative.    Genitourinary: Negative.    Neurological: Negative.    Psychiatric/Behavioral: Negative.     Allergic/Immunologic: Negative.        Physical Exam   Vitals reviewed.  HENT:   Head: Normocephalic and atraumatic.   Eyes: No scleral icterus.   Cardiovascular: Normal rate, regular rhythm and normal heart sounds.   Pulmonary/Chest: Effort normal and breath sounds normal.   Musculoskeletal:         General: Normal range of motion.      Cervical back: Normal range of motion and neck supple.   Neurological: He is alert and oriented to person, place, and time.   Skin: Skin is warm and dry.   Psychiatric: Mood, judgment and thought content normal.     Cardiovascular Studies  Preliminary ECG today shows sinus rhythm 86 bpm, PR 178 ms, QRS 90 ms, and QTc 476 ms.    Cardiovascular Health Factors  Vitals BP Readings from Last 3 Encounters:   09/21/23 136/78   07/08/23 120/54   02/28/23 129/84     Wt Readings from Last 3 Encounters:   09/21/23 (!) 144.3 kg (318 lb 3.2 oz)   07/07/23 (!) 148.3 kg (327 lb)   02/28/23 (!) 147.7 kg (325 lb 9.6 oz)     BMI Readings from Last 3 Encounters:   09/21/23 41.98 kg/m?   07/07/23 43.14 kg/m?   02/28/23 42.96 kg/m?      Smoking Social History     Tobacco Use   Smoking Status Never   Smokeless Tobacco Never      Lipid Profile Cholesterol   Date Value Ref Range Status   01/22/2022 163  Final     HDL   Date Value Ref Range Status   01/22/2022 39 (L) >=40 Final     LDL   Date Value Ref Range Status   01/22/2022 103 (H) <100 Final     Triglycerides   Date Value Ref Range Status   01/22/2022 109  Final      Blood Sugar Hemoglobin A1C   Date Value Ref Range Status   07/05/2023 7.5 (H) 4.0 - 5.7 % Final     Comment:     The ADA recommends that most patients with type 1 and type 2 diabetes maintain an A1c level <7%.     Glucose   Date Value Ref Range Status   07/08/2023 122 (H) 70 - 100 mg/dL Final   98/06/9146 97 70 - 100 mg/dL Final   82/95/6213 086 (H) 70 - 100 mg/dL Final     Glucose, POC   Date Value Ref Range Status   07/08/2023 215 (H) 70 - 100 mg/dL Final   57/84/6962 952 (H) 70 - 100 mg/dL Final   84/13/2440 102 (H) 70 - 100 mg/dL Final          Problems Addressed Today  Encounter Diagnoses   Name Primary?    Cardiovascular symptoms Yes    Hypertension, unspecified type        Assessment and Plan     Persistent atrial fibrillation  He underwent prior AF ablation in 2009.  Over the last year or so he has had recurrent AF despite Flecainide initially and then Tikosyn.  He underwent a PFA PVI with Dr. Bradly Bienenstock on 12/5.  He feels less tiredness since his ablation procedure.  ECG today shows sinus rhythm with acceptable QTc.  He is not symptomatically aware of his AF.  He  tells me his blood pressure machine at home has suggested AF on a few occasions but these episodes have been brief.  He has an appointment with Dr. Wallene Huh in just a few weeks and given that short timeframe and his history we will just have him continue Tikosyn until he sees Dr. Wallene Huh in follow-up.  Echocardiogram at time of his ablation showed an LVEF of 45%.  We will plan to repeat an echocardiogram just prior to Dr. Johnanna Schneiders appointment for reevaluation of his EF with maintenance of sinus rhythm.    Hypertension  Blood pressure well-controlled.  He reports even better home measurements.  I have not made any changes to his medications.    Obesity  He has been a dietary improvements over the last several weeks.  He is also on Ozempic.  He has had about a 10 pound weight loss between today and his hospital discharge.  He was congratulated on his results and encouraged on his ongoing efforts.    Sharman Cheek, APRN-C            Current Medications (including today's revisions)   apixaban (ELIQUIS) 5 mg tablet Take one tablet by mouth twice daily.    atorvastatin (LIPITOR) 20 mg tablet Take one tablet by mouth at bedtime daily.    CHOLEcalciferoL (vitamin D3) 1,000 units tablet Take one tablet by mouth daily.    dofetilide (TIKOSYN) 500 mcg capsule Take one capsule by mouth twice daily. Indications: atrial fibrillation electrically shocked to normal rhythm    escitalopram oxalate (LEXAPRO) 10 mg tablet Take 1.5 tablets by mouth daily.    FARXIGA 10 mg tablet Take one tablet by mouth daily.    lisinopriL (ZESTRIL) 10 mg tablet Take 1 tablet by mouth once daily    magnesium oxide (MAGOX) 400 mg (241.3 mg magnesium) tablet Take one tablet by mouth twice daily. Indications: Magnesium supplementation    metFORMIN (GLUCOPHAGE) 1,000 mg tablet Take one tablet by mouth twice daily with meals.    metoprolol succinate XL (TOPROL XL) 50 mg extended release tablet Take two tablets by mouth daily. Indications: ventricular rate control in atrial fibrillation    OZEMPIC 1 mg/dose (4 mg/3 mL) injection PEN Inject one mg under the skin every 7 days.    pioglitazone (ACTOS) 30 mg tablet Take one tablet by mouth daily.

## 2023-09-24 ENCOUNTER — Encounter: Admit: 2023-09-24 | Discharge: 2023-09-24 | Payer: BC Managed Care – PPO

## 2023-10-12 ENCOUNTER — Encounter: Admit: 2023-10-12 | Discharge: 2023-10-12 | Payer: BC Managed Care – PPO

## 2023-10-12 ENCOUNTER — Ambulatory Visit: Admit: 2023-10-12 | Discharge: 2023-10-12 | Payer: BC Managed Care – PPO

## 2023-10-12 ENCOUNTER — Ambulatory Visit: Admit: 2023-10-12 | Discharge: 2023-10-13 | Payer: BC Managed Care – PPO

## 2023-10-12 DIAGNOSIS — R0989 Other specified symptoms and signs involving the circulatory and respiratory systems: Secondary | ICD-10-CM

## 2023-10-12 DIAGNOSIS — Z6841 Body Mass Index (BMI) 40.0 and over, adult: Secondary | ICD-10-CM

## 2023-10-12 MED ORDER — DOFETILIDE 500 MCG PO CAP
500 ug | ORAL_CAPSULE | Freq: Two times a day (BID) | ORAL | 1 refills | Status: AC
Start: 2023-10-12 — End: ?

## 2023-10-12 NOTE — Progress Notes
 Date of Service: 10/12/2023    Jeremy Bean  is a 58 y.o. male     Referred by:     HPI    Subjective         The patient is a 58 year old male with severe atrial fibrillation who presents for cardiac electrophysiology follow-up visit.    He has a history of recurrent highly symptomatic paroxysmal atrial fibrillation, status post ablation in March 2009, and residual atrial flutter ablation in November 2010. In February 2024, he was in persistent atrial fibrillation despite being on Eliquis and flecainide, leading to a scheduled outpatient cardioversion. However, he was noted to be in sinus rhythm upon arrival for the procedure. A stress test was negative for ischemia. He was admitted in May 2024 for dofetilide initiation due to recurrent episodes and again in December 2024 for persistent atrial fibrillation requiring cardioversion. He underwent a pulse field ablation in December 2024 and was continued on Tikosyn. He feels 'a lot better' since the last ablation, with reduced tiredness and improved energy levels. No current leg swelling. He notes that since the ablation, he has been able to sleep longer at night before needing to urinate, which he attributes to the resolution of atrial fibrillation symptoms.    He has a history of hypertension and is on lisinopril and metoprolol for blood pressure management, taking both metoprolol tablets together. He experiences shoulder pain, which he believes may have contributed to a higher blood pressure reading during the visit.    He has a history of obesity, with recent efforts to lose weight. He reports losing 18 pounds, reducing from 325 to 316 pounds, and aims to reach 230 pounds. He attributes his weight loss to dietary changes, including reducing caffeine intake by eliminating daily iced tea and caffeinated sodas, and switching to sugar-free and caffeine-free beverages. He has also modified his meals, opting for lighter breakfasts and lunches, and smaller portions at dinner. He is on Ozempic for weight loss and obesity management.    He has a history of excessive caffeine use, which he has significantly reduced. He used to consume iced tea and caffeinated sodas daily but now limits iced tea to weekends and drinks sugar-free, caffeine-free sodas occasionally. He drinks flavored waters and water with lemon during the week.    He has a history of diabetes, with blood sugar readings of 160 mg/dL in the morning and 161 mg/dL in the evening. He is currently on Farxiga for diabetes management.    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 He also takes Eliquis as a blood thinner and a cholesterol medication.                MEASUREMENTS: Weight- 143.  EXTREMITIES: No leg swelling.            Assessment and Plan    Problems Addressed Today  Encounter Diagnoses   Name Primary?    Cardiovascular symptoms Yes    Body mass index (BMI) 40.0-44.9, adult (HCC)             DIAGNOSTIC  Stress test: Negative for ischemia  Echocardiography: Heart function is strong    PROCEDURE NOTES  Pulse field ablation: Performed by Dr. Bradly Bienenstock (07/07/2023)               Paroxysmal Atrial Fibrillation  Recurrent highly symptomatic paroxysmal atrial fibrillation, status post multiple ablations, with the most recent pulse field ablation  PVI +posterior wall in December 2024. Significant improvement in symptoms post-ablation, including reduced fatigue and improved exercise tolerance. Previously on Tikosyn without significant adverse effects. Occasional episodes of atrial fibrillation detected by home blood pressure cuff resolve spontaneously.  - Continue Tikosyn for now  - Plan to discontinue Tikosyn in three months if no recurrence of atrial fibrillation  - Schedule a heart monitor in three months to assess rhythm  - If rhythm is stable, discontinue Tikosyn three days before heart monitor  - If atrial fibrillation recurs, consider resuming Tikosyn    Hypertension  Hypertension managed with lisinopril and metoprolol. Blood pressure readings have been variable, with recent elevated readings potentially due to pain and dietary salt intake. Advised to monitor blood pressure closely and adjust lifestyle factors such as salt intake.  - Continue lisinopril and metoprolol  - Advise regular blood pressure monitoring  - Counsel on reducing dietary salt intake    Diabetes Mellitus  Diabetes mellitus with regular blood glucose monitoring. Advised to work closely with diabetes specialist to adjust medications as weight loss progresses. Current blood glucose levels are 160 in the morning and 120 in the evening.  - Collaborate with diabetes specialist to adjust medications as needed  - Monitor blood glucose levels regularly    Obesity  Obesity with recent weight loss. Currently on Ozempic for weight management. Encouraged to continue dietary changes and weight loss efforts, including reduced caffeine and sugar intake.  - Continue Ozempic for weight management  - Encourage continued dietary modifications and weight loss efforts    Follow-up  Advised to follow up with cardiology team and primary care provider to monitor and manage conditions. Emphasized the importance of maintaining lifestyle changes to prevent recurrence of atrial fibrillation.  - Schedule follow-up visit with cardiology in six months  - Advise follow-up with primary care provider for cholesterol and hemoglobin A1c testing           All results for 12-Lead ECG this visit   ECG 12-LEAD    Collection Time: 10/12/23 11:22 AM   Result Value Status    VENTRICULAR RATE 82 Incomplete    P-R INTERVAL 166 Incomplete    QRS DURATION 86 Incomplete    Q-T INTERVAL 408 Incomplete    QTC CALCULATION (BAZETT) 476 Incomplete    P AXIS 67 Incomplete    R AXIS 81 Incomplete    T AXIS 62 Incomplete    Impression    Sinus rhythm with premature atrial complexes  Otherwise normal ECG  When compared with ECG of 21-Sep-2023 14:43,  premature atrial complexes are now present        Thank you for letting us participate in the care of your patient. Please feel free to contact us if you have any questions or concerns.           Vitals:    10/12/23 1108   BP: (!) 144/88   BP Source: Arm, Right Upper   Pulse: 90   SpO2: 97%   O2 Device: None (Room air)   PainSc: Zero   Weight: (!) 143.2 kg (315 lb 12.8 oz)   Height: 185.4 cm (6' 1)      Body mass index is 41.66 kg/m?Marland Kitchen     Past Medical History         Atrial fibrillation (CMS-HCC)  Cataract  Diabetes (CMS-HCC)  Enlarged LA (left atrium)  History of hernia repair  History of renal calculi      Comment:  Bilateral  History  of rotator cuff surgery  HTN (hypertension)  HX: anticoagulation  Obesity  Obstructive sleep apnea     Review of Systems   Constitutional: Negative.   HENT: Negative.     Eyes: Negative.    Cardiovascular: Negative.    Respiratory: Negative.     Endocrine: Negative.    Hematologic/Lymphatic: Negative.    Skin: Negative.    Musculoskeletal: Negative.    Gastrointestinal: Negative.    Genitourinary: Negative.    Neurological: Negative.    Psychiatric/Behavioral: Negative.     Allergic/Immunologic: Negative.         Physical Exam     Patient is a moderately well-built gentleman who is comfortable at rest,  not in any distress.  Sclerae anicteric.  The oral mucosa is moist and pink.  Neck is  supple without any lymphadenopathy.  Lungs are clear to auscultation bilaterally.  Breath  sounds are normal.  Cardiac exam reveals normal S1, S2 with regular rate and  rhythm.  No murmurs, rubs or gallops noted.  Abdomen:  Soft, nontender, nondistended.  Bowel sounds are present.  Extremities:  No cyanosis, clubbing or edema.  Peripheral  pulses are symmetric.  Skin without any rash. . No gross motor or neuro deficits.      Cardiovascular Studies           Cardiovascular Health Factors  Vitals BP Readings from Last 3 Encounters:   10/12/23 (!) 144/88   10/12/23 (!) 156/88 09/21/23 136/78     Wt Readings from Last 3 Encounters:   10/12/23 (!) 143.2 kg (315 lb 12.8 oz)   10/12/23 (!) 143.2 kg (315 lb 12.8 oz)   09/21/23 (!) 144.3 kg (318 lb 3.2 oz)     BMI Readings from Last 3 Encounters:   10/12/23 41.66 kg/m?   10/12/23 41.67 kg/m?   09/21/23 41.98 kg/m?      Smoking Social History     Tobacco Use   Smoking Status Never   Smokeless Tobacco Never      Lipid Profile Cholesterol   Date Value Ref Range Status   01/22/2022 163  Final     HDL   Date Value Ref Range Status   01/22/2022 39 (L) >=40 Final     LDL   Date Value Ref Range Status   01/22/2022 103 (H) <100 Final     Triglycerides   Date Value Ref Range Status   01/22/2022 109  Final      Blood Sugar Hemoglobin A1C   Date Value Ref Range Status   07/05/2023 7.5 (H) 4.0 - 5.7 % Final     Comment:     The ADA recommends that most patients with type 1 and type 2 diabetes maintain an A1c level <7%.     Glucose   Date Value Ref Range Status   07/08/2023 122 (H) 70 - 100 mg/dL Final   65/78/4696 97 70 - 100 mg/dL Final   29/52/8413 244 (H) 70 - 100 mg/dL Final     Glucose, POC   Date Value Ref Range Status   07/08/2023 215 (H) 70 - 100 mg/dL Final   08/04/7251 664 (H) 70 - 100 mg/dL Final   40/34/7425 956 (H) 70 - 100 mg/dL Final          Current Medications (including today's revisions)   apixaban (ELIQUIS) 5 mg tablet Take one tablet by mouth twice daily.    atorvastatin (LIPITOR) 20 mg tablet Take one tablet by mouth at bedtime daily.  CHOLEcalciferoL (vitamin D3) 1,000 units tablet Take one tablet by mouth daily.    dofetilide (TIKOSYN) 500 mcg capsule Take one capsule by mouth twice daily. Indications: atrial fibrillation electrically shocked to normal rhythm    escitalopram oxalate (LEXAPRO) 10 mg tablet Take 1.5 tablets by mouth daily.    FARXIGA 10 mg tablet Take one tablet by mouth daily.    lisinopriL (ZESTRIL) 10 mg tablet Take 1 tablet by mouth once daily    magnesium oxide (MAGOX) 400 mg (241.3 mg magnesium) tablet Take one tablet by mouth twice daily. Indications: Magnesium supplementation    metFORMIN (GLUCOPHAGE) 1,000 mg tablet Take one tablet by mouth twice daily with meals.    metoprolol succinate XL (TOPROL XL) 50 mg extended release tablet Take two tablets by mouth daily. Indications: ventricular rate control in atrial fibrillation    OZEMPIC 1 mg/dose (4 mg/3 mL) injection PEN Inject one mg under the skin every 7 days.    pioglitazone (ACTOS) 30 mg tablet Take one tablet by mouth daily.

## 2024-02-06 ENCOUNTER — Encounter: Admit: 2024-02-06 | Discharge: 2024-02-06 | Payer: BLUE CROSS/BLUE SHIELD

## 2024-03-16 ENCOUNTER — Encounter: Admit: 2024-03-16 | Discharge: 2024-03-16 | Payer: BLUE CROSS/BLUE SHIELD

## 2024-03-16 DIAGNOSIS — Z136 Encounter for screening for cardiovascular disorders: Principal | ICD-10-CM

## 2024-03-16 DIAGNOSIS — I10 Essential (primary) hypertension: Secondary | ICD-10-CM

## 2024-03-16 DIAGNOSIS — I4819 Other persistent atrial fibrillation: Secondary | ICD-10-CM

## 2024-03-16 NOTE — Progress Notes
 Date of Service: 03/16/2024    Jeremy Bean. is a 58 y.o. male.       HPI     Jeremy Bean was seen in the office today in electrophysiology follow-up. As you may know, he is a 58 y.o. male, with past medical history including hypertension, obesity, obstructive sleep apnea, and paroxysmal atrial fibrillation s/p ablation (2009) and residual typical flutter s/p ablation (2010).  He follows with Dr. Liborio.     He presents to clinic in 3 month follow-up.      He underwent PVI in 2009 and CTI ablation in 2010.  He had recurrence of AF with RVR in 2014 and Flecainide  was resumed.  In February 2024 he was noted to be in AF at clinic visit.  Flecainide  increased to 150 mg twice daily with subsequent spontaneous conversion.  Regadenoson  MPI from March 2024 did not show any evidence of ischemia.  By late April 2024 he was again in AF and was admitted in May 2024 initiated on Tikosyn  500 mcg twice daily.    At follow-up in July 2024 he was maintaining sinus rhythm and was feeling overall better.  However, an ambulatory monitor in November 2024 revealed a 100% AF burden.  He was directly admitted to the hospital in December 2024 for management.  He underwent successful cardioversion on 12/4 but had recurrent AF.  Echocardiogram revealed an LVEF of 45%.  On 12/5 he underwent a PFA PVI ablation with Dr. Laurian while in inpatient.    I met with Jeremy Bean in February 2025.  He was maintaining sinus rhythm.    He met with Dr. Liborio in mid March 2025.  He was maintaining sinus rhythm.  Tikosyn  was continued with plans to discontinue in 3 months if no recurrent AF noted.    Subsequent limited echocardiogram 10/12/2023 revealed and LVEF 54%, no regional wall motion abnormalities, and no valvular abnormalities.          Today in clinic ECG shows sinus rhythm 83 bpm.  Jeremy Bean tells me that he feels like he has been in rhythm since December.  Blood pressure well-controlled.  He tells me he plans to buy a Kardia mobile device to better monitor his rhythm.         Vitals:    03/16/24 1542   BP: 125/80   BP Source: Arm, Left Upper   Pulse: 92   SpO2: 95%   O2 Device: None (Room air)   PainSc: Zero   Weight: (!) 145.7 kg (321 lb 3.2 oz)   Height: 185.4 cm (6' 1)     Body mass index is 42.38 kg/m?SABRA     Past Medical History  Patient Active Problem List    Diagnosis Date Noted    Body mass index (BMI) 40.0-44.9, adult (CMS-HCC) 10/12/2023    Atrial fibrillation with rapid ventricular response (CMS-HCC) 07/05/2023    Encounter for monitoring dofetilide  therapy 12/21/2022    Atrial fibrillation (CMS-HCC) 07/26/2018    Abdominal pain 07/26/2018    OSA on CPAP 12/31/2013    Obstructive sleep apnea 06/05/2009     Obstructive sleep apnea status post CPAP initiation approximately three weeks ago      History of rotator cuff surgery 06/05/2009     Rotator cuff repair three years ago      S/P appendectomy 06/05/2009    History of hernia repair 06/05/2009    Coronary artery disease (CAD) excluded 06/05/2009     0/20/08 cardiac catheterization: Normal  coronary arteries and LV function. Echo  10/08 left atrial diameter 4.5 cm. EF 63%, confirmed on TEE in 03/09.  12/06/2012 - Stress Test:  No chest pain with exercise stress.  Poor exercise tolerance for age.  Submaximal ECG exercise stress test per Bruce protocol which was negative for ischemia at the workload achieved.  Occasional atrial and ventricular ectopy; No dysrhythmias noted.  Exercise echocardiogram which was negative for ischemia at workload achieved (However, the sensitivity of test is markedly diminished secondary to low workload and low double product)  12/28/2017 - Regadenoson  MPI:  This study is mildly abnormal qualitatively.  There is subtle ischemia perhaps in a diagonal and in the right coronary artery territory.  The lung uptake is in the normal range but the TID ratio is borderline.  From a perfusion standpoint defects are not high risk and quantitative polar map assessment of the defects are not statistically significant.  11/29/2019 - Regadenoson  MPI:  This myocardial pressure imaging study is very mildly abnormal but overall not high risk.  There is very small size and mild intensity inferior wall perfusion abnormality that could represent a small ischemic territory towards the LV apex in the RCA distribution.  There are no large areas of jeopardized ischemic myocardium.  Left ventricular cavity was not dilated at rest and did not further delay to stress.  Normal left ventricle systolic function, ejection fraction 66%, there are no regional wall motion abnormalities.  Normal pulmonary to myocardial count ratio and normal left ventricular end-diastolic volume.  Overall, high risk scintigraphic indicators are not present.           Enlarged LA (left atrium) 06/05/2009     Most recent echo today on Dec 04, 2008 shows left atrium actually 3.8 cm and left       ventricular dimension 4.6 cm.      AF (atrial fibrillation) (CMS-HCC) 10/24/2007      Paroxysmal atrial fibrillation x 3 years. Became persistent and highly symptomatic status post left atrial ablation for atrial fibrillation 10/24/07 with no re-inducibility in spite of Isuprel and burst pacing up to 200 msec. Patient has isolated arrhythmias coming from the coronary sinus from the deep coronary sinus brought out by high-dose Isuprel.    07/27/2018 - ECHO:  Low normal left ventricle systolic function with estimated LVEF of 50%.  The right ventricle is not visualized well. Limited views suggest normal right ventricular size and contractility.  Mild biatrial dilatation.  Aortic sclerosis without stenosis.  No other significant valvular abnormalities.  No pericardial effusion.  07/31/2018 - TEE / DCCV:  Successful DC cardioversion of Atrial fibrillation to sinus rhythm.   08/03/2018 - DCCV:   Successful DC cardioversion of Atrial fibrillation to sinus rhythm.       Hypertension 10/24/2007     Hypertension x 2 years           Review of Systems   All other systems reviewed and are negative.      Physical Exam   Vitals reviewed.  Eyes: No scleral icterus.   Cardiovascular: Normal rate, regular rhythm and normal heart sounds.   Pulmonary/Chest: Effort normal and breath sounds normal.   Musculoskeletal:         General: Normal range of motion.      Cervical back: Normal range of motion.   Neurological: He is alert and oriented to person, place, and time.   Skin: Skin is warm.   Psychiatric: Mood, judgment and thought content normal.  Cardiovascular Studies  Preliminary ECG today shows sinus rhythm 83 bpm, PR 166 ms, QRS 88 ms, and QTc 467 ms.    Cardiovascular Health Factors  Vitals BP Readings from Last 3 Encounters:   03/16/24 125/80   10/12/23 (!) 156/88   10/12/23 (!) 144/88     Wt Readings from Last 3 Encounters:   03/16/24 (!) 145.7 kg (321 lb 3.2 oz)   10/12/23 (!) 143.2 kg (315 lb 12.8 oz)   10/12/23 (!) 143.2 kg (315 lb 12.8 oz)     BMI Readings from Last 3 Encounters:   03/16/24 42.38 kg/m?   10/12/23 41.67 kg/m?   10/12/23 41.66 kg/m?      Smoking Tobacco Use History[1]   Lipid Profile Cholesterol   Date Value Ref Range Status   01/22/2022 163  Final     HDL   Date Value Ref Range Status   01/22/2022 39 (L) >=40 Final     LDL   Date Value Ref Range Status   01/22/2022 103 (H) <100 Final     Triglycerides   Date Value Ref Range Status   01/22/2022 109  Final      Blood Sugar Hemoglobin A1C   Date Value Ref Range Status   07/05/2023 7.5 (H) 4.0 - 5.7 % Final     Comment:     The ADA recommends that most patients with type 1 and type 2 diabetes maintain an A1c level <7%.     Glucose   Date Value Ref Range Status   07/08/2023 122 (H) 70 - 100 mg/dL Final   87/94/7975 97 70 - 100 mg/dL Final   87/95/7975 898 (H) 70 - 100 mg/dL Final     Glucose, POC   Date Value Ref Range Status   07/08/2023 215 (H) 70 - 100 mg/dL Final   87/93/7975 861 (H) 70 - 100 mg/dL Final   87/94/7975 888 (H) 70 - 100 mg/dL Final          Problems Addressed Today  Encounter Diagnoses   Name Primary?    Screening for heart disease Yes    Hypertension, unspecified type     Persistent atrial fibrillation (CMS-HCC)        Assessment and Plan     Persistent atrial fibrillation  He has had prior AF and AFL ablations back in 2009 and 2010.  Most of last year he struggled with recurrent persistent AF.  He ultimately underwent a PFA PVI on 12/5.  He has been maintaining sinus rhythm since that time.  He has remained on Tikosyn  500 mcg twice daily.  ECG today shows sinus rhythm.  We will discontinue Tikosyn  today.  Jeremy Bean is aware that if he has any significant recurrent AF he may need to resume Tikosyn .  In case that becomes necessary, we will continue him on Eliquis  5 mg twice daily for now, although he may be able to get off of this in the future.  He will continue Toprol -XL 100 mg daily.  He plans to buy a Kardia mobile device to better monitor his rhythms and I encouraged him to do so.  He may be a good candidate for ILR in the future as he is not always aware of his AF.    We will mail him a 7-day Zio patch in about 4 months.  We will have him follow-up with Dr. Dendi in about 5 months if possible so if we need to resume Tikosyn  we are within our 11-month window.  LVEF in December prior to ablation was 45%.  Limited echo in March showed improvement to 54%.    Hypertension  Blood pressure well-controlled today.  I have not made any changes in his medications.  He is on Lisinopril  10 mg daily and Toprol -XL 100 mg daily.    I have asked him to follow-up with Dr. Dendi in about 5 months.    Jeremy Gauss, APRN-C     Total Time Today was 40 minutes in the following activities: Preparing to see the patient, Obtaining and/or reviewing separately obtained history, Performing a medically appropriate examination and/or evaluation, Counseling and educating the patient/family/caregiver, Ordering medications, tests, or procedures, Documenting clinical information in the electronic or other health record, Independently interpreting results (not separately reported) and communicating results to the patient/family/caregiver, and Care coordination (not separately reported)           Current Medications (including today's revisions)   apixaban  (ELIQUIS ) 5 mg tablet Take one tablet by mouth twice daily.    atorvastatin (LIPITOR) 20 mg tablet Take one tablet by mouth at bedtime daily.    CHOLEcalciferoL (vitamin D3) 1,000 units tablet Take one tablet by mouth daily.    escitalopram  oxalate (LEXAPRO ) 10 mg tablet Take 1.5 tablets by mouth daily.    FARXIGA  10 mg tablet Take one tablet by mouth daily.    lisinopriL  (ZESTRIL ) 10 mg tablet Take 1 tablet by mouth once daily    magnesium  oxide (MAGOX) 400 mg (241.3 mg magnesium ) tablet Take one tablet by mouth twice daily. Indications: Magnesium  supplementation    metFORMIN  (GLUCOPHAGE ) 1,000 mg tablet Take one tablet by mouth twice daily with meals.    metoprolol  succinate XL (TOPROL  XL) 50 mg extended release tablet Take 2 tablets by mouth once daily    OZEMPIC 1 mg/dose (4 mg/3 mL) injection PEN Inject one mg under the skin every 7 days.    pioglitazone  (ACTOS ) 30 mg tablet Take one tablet by mouth daily.                 [1]   Social History  Tobacco Use   Smoking Status Never   Smokeless Tobacco Never

## 2024-06-06 ENCOUNTER — Encounter: Admit: 2024-06-06 | Discharge: 2024-06-06 | Payer: BLUE CROSS/BLUE SHIELD

## 2024-06-09 ENCOUNTER — Encounter: Admit: 2024-06-09 | Discharge: 2024-06-09 | Payer: BLUE CROSS/BLUE SHIELD

## 2024-07-16 ENCOUNTER — Ambulatory Visit: Admit: 2024-07-16 | Discharge: 2024-07-16 | Payer: BLUE CROSS/BLUE SHIELD

## 2024-07-16 ENCOUNTER — Encounter: Admit: 2024-07-16 | Discharge: 2024-07-16 | Payer: BLUE CROSS/BLUE SHIELD

## 2024-07-16 NOTE — Progress Notes [1]
 To our valued patient,     We have enrolled your heart monitor and requested it be sent to your home.  You should receive this within 2-3 business days. Please wear the monitor for 7 days. When you have completed the study, please remove the device, and mail it back to the company. Please call [employer] Customer Service at [phone] with questions about placement, troubleshooting, and insurance coverage. You can reach the [facility] ambulatory heart monitor team at [phone].      Your Heart Rhythm Management Team  Cardiovascular Medicine Department at [facility]              Ambulatory (External) Cardiac Monitor Enrollment Record     Placement Location: Home Enrollment  Clinic Location: [facility]  Vendor: [employer] ([employer])  Mobile Cardiac Telemetry (MCOT/MCT)?: No  Duration of Monitor (in days): 7  Monitor Diagnosis: Other (Other persistent atrial fibrillation - I48.19)  Secondary Monitor Diagnosis: Hypertension (I10)  Ordering Provider: Zoraida Mt  AMB Monitor Serial Number: home  No data recorded    Start Time and Date: 07/16/24 2:29 PM   Patient Name: Jeremy Bean  DOB: [birth year]  MRN: [id number]  Sex: male  Mobile Phone Number: [phone] (mobile)  Home Phone Number: [phone]  Patient Address: [address] [place] [address]  Insurance Coverage: [employer] [place] [employer] BLUE  Insurance ID: [id number]  Insurance Group #: [id number]  Insurance Subscriber: MATTHIAS JR,[place] C  Implanted Cardiac Device Information: No results found for: [id number]      Patient instructed to contact company phone number on the monitor box with questions regarding billing, placement, troubleshooting.     Shanda Clause    ____________________________________________________________    Clinic Staff:    Complete additional steps for documentation double check/Co-Sign.  In Follow-up, send chart upon closing encounter to [facility] AMBULATORY MONITORS    [facility]M Ambulatory Monitoring Team:  Schedule on appropriate template and check-in.   Clinic Placement Schedule on clinic location [facility] schedule   Home Enrollment Schedule on Home Enrollment schedule ([facility])   Given to patient in clinic for self-placement Schedule on Home Enrollment schedule ([facility])   Inpatient Schedule on [facility] AMBULATORY MONITORING template   2. Please enroll with appropriate vendor.

## 2024-08-14 ENCOUNTER — Encounter: Admit: 2024-08-14 | Discharge: 2024-08-14 | Payer: BLUE CROSS/BLUE SHIELD

## 2024-08-28 ENCOUNTER — Encounter: Admit: 2024-08-28 | Discharge: 2024-08-28 | Payer: BLUE CROSS/BLUE SHIELD

## 2024-08-28 NOTE — Telephone Encounter [36]
 RC to patient. He has OV with RAD scheduled Friday. He has been out of a job and no insurance for 3 months. He can't afford the out of pocket cost for the appointment. We will cancel the OV. He will CB whenever he gets insurance again. Reviewed plan with the patient. Patient verbalized understanding and does not have any further questions or concerns. No further education requested from patient. Patient has our contact information for future needs.

## 2024-08-28 NOTE — Telephone Encounter [36]
-----   Message from Mineral B sent at 08/28/2024  1:21 PM CST -----  Regarding: RAD- migh have to cancel  VM on triage line from patient at 12:16pm.  Said that he might have to cancel Friday.  Call him to discuss at #[phone].
